# Patient Record
Sex: Female | Born: 1945 | Race: White | Hispanic: No | Marital: Married | Smoking: Former smoker
Health system: Southern US, Community
[De-identification: ages and names within clinical notes are randomized; demographics above are authoritative.]

## PROBLEM LIST (undated history)

## (undated) DIAGNOSIS — R42 Dizziness and giddiness: Secondary | ICD-10-CM

## (undated) DIAGNOSIS — M1990 Unspecified osteoarthritis, unspecified site: Secondary | ICD-10-CM

## (undated) DIAGNOSIS — R011 Cardiac murmur, unspecified: Secondary | ICD-10-CM

## (undated) DIAGNOSIS — T7840XA Allergy, unspecified, initial encounter: Secondary | ICD-10-CM

## (undated) DIAGNOSIS — J302 Other seasonal allergic rhinitis: Secondary | ICD-10-CM

## (undated) DIAGNOSIS — I10 Essential (primary) hypertension: Secondary | ICD-10-CM

## (undated) DIAGNOSIS — E785 Hyperlipidemia, unspecified: Secondary | ICD-10-CM

## (undated) HISTORY — DX: Dizziness and giddiness: R42

## (undated) HISTORY — DX: Essential (primary) hypertension: I10

## (undated) HISTORY — DX: Allergy, unspecified, initial encounter: T78.40XA

## (undated) HISTORY — DX: Unspecified osteoarthritis, unspecified site: M19.90

## (undated) HISTORY — DX: Hyperlipidemia, unspecified: E78.5

## (undated) HISTORY — DX: Cardiac murmur, unspecified: R01.1

## (undated) HISTORY — DX: Other seasonal allergic rhinitis: J30.2

---

## 1988-08-31 HISTORY — PX: BACK SURGERY: SHX140

## 1994-08-31 HISTORY — PX: TOE SURGERY: SHX1073

## 1995-09-01 HISTORY — PX: FOOT SURGERY: SHX648

## 1998-08-31 HISTORY — PX: ABDOMINAL HYSTERECTOMY: SHX81

## 2004-08-31 HISTORY — PX: KNEE SURGERY: SHX244

## 2008-08-31 HISTORY — PX: HAND SURGERY: SHX662

## 2011-02-20 ENCOUNTER — Ambulatory Visit (HOSPITAL_BASED_OUTPATIENT_CLINIC_OR_DEPARTMENT_OTHER)
Admission: RE | Admit: 2011-02-20 | Discharge: 2011-02-20 | Disposition: A | Discharge status: Home / Self Care | Payer: BC Managed Care – PPO | Source: Ambulatory Visit | Attending: Orthopedic Surgery | Admitting: Orthopedic Surgery

## 2011-02-20 DIAGNOSIS — Z01812 Encounter for preprocedural laboratory examination: Secondary | ICD-10-CM | POA: Insufficient documentation

## 2011-02-20 DIAGNOSIS — S82409A Unspecified fracture of shaft of unspecified fibula, initial encounter for closed fracture: Secondary | ICD-10-CM | POA: Insufficient documentation

## 2011-02-20 DIAGNOSIS — S93439A Sprain of tibiofibular ligament of unspecified ankle, initial encounter: Secondary | ICD-10-CM | POA: Insufficient documentation

## 2011-02-20 DIAGNOSIS — X58XXXA Exposure to other specified factors, initial encounter: Secondary | ICD-10-CM | POA: Insufficient documentation

## 2011-02-23 LAB — POCT HEMOGLOBIN-HEMACUE: Hemoglobin: 14.6 g/dL (ref 12.0–15.0)

## 2011-03-16 NOTE — Op Note (Signed)
Meghan Carpenter, Meghan Carpenter                 ACCOUNT NO.:  [id number]  MEDICAL RECORD NO.:  [id number]  LOCATION:                                 FACILITY:  PHYSICIAN:  Leonides Grills, M.D.          DATE OF BIRTH:  DATE OF PROCEDURE:  02/20/2011 DATE OF DISCHARGE:                              OPERATIVE REPORT   PREOPERATIVE DIAGNOSES: 1. Right distal tibia-fibula syndesmotic rupture. 2. Right proximal fibular shaft fracture.  POSTOPERATIVE DIAGNOSES: 1. Right distal tibia-fibula syndesmotic rupture. 2. Right proximal fibular shaft fracture.  OPERATIONS: 1. Open reduction and internal fixation, right distal tibia-fibula     syndesmotic rupture. 2. Conservative management with manipulation, fibular shaft fracture.  ANESTHESIA:  General.  SURGEON:  Leonides Grills, MD  ASSISTANT:  Richardean Canal, PA  ESTIMATED BLOOD LOSS:  Minimal.  TOURNIQUET TIME:  Approximately half an hour.  COMPLICATIONS:  None.  DISPOSITION:  Stable to PR.  INDICATIONS:  This is a 65 year old female who sustained the above injury.  She was consented to the above procedure.  All risks including infection neurovascular injury, nonunion, malunion, hardware tissue, and hardware failure, persistent pain, worsening pain, prolonged recovery, stiffness, arthritis, the fact that this the screws to be removed likely in the future, wound healing problems, DVT, and PE were all explained. Questions were encouraged and answered.  OPERATION:  The patient was brought to the operating room and placed in supine position after adequate general anesthesia administered as well as Ancef 1 g IV piggyback.  The right lower extremity was then prepped and draped in a sterile manner over proximally thigh tourniquet once bump was placed on the ipsilateral hip.  Anatomical landmarks including fibular shaft were then mapped out.  Under C-arm guidance, we then mapped out the joint line as well.  We then made a longitudinal incision over  the lateral malleolus.  Dissection was carried down through skin. Hemostasis was obtained.  We then made a small nick on the medial aspect of the ankle over the metaphyseal flare.  We then applied a 2-hole one- third tubular plate in a posterolateral position on the lateral malleolus.  This would act as a washer.  We then openly reduced the syndesmosis applying a Darrick Penna clamp to the proximal most portion of the plate.  Once this was anatomically reduced and with the ankle in neutral dorsiflexion, we then placed a 4.5-mm fully-threaded cortical set screw using a 3.2-mm drill hole respectively.  This was done parallel to the joint surface through the 2-hole plate.  This had excellent purchase and maintenance of the desired reduction.  We then removed the Scnetx clamp and then placed another 4.5-mm fully- threaded cortical set screw using a 3.2-mm drill hole respectively. Again, this had excellent purchase and maintenance of the anatomic reduction.  The fibular shaft which was indirectly reduced by doing the reduction of the syndesmosis was left to heal conservatively. Anatomical landmarks include Shenton line in the ankle and dime sign were restored.  Range of motion of the ankle was excellent.  Stress x- rays were obtained in the AP, lateral, and mortise views, and showed no gross motion fixation,  proposition, excellent alignment as well.  No lateral subluxation of the talus within the ankle mortise or diastasis of the distal tib-fib syndesmosis.  Tourniquet was deflated.  Hemostasis was obtained.  The wound was copiously irrigated with  normal saline. Subcu was closed with 3-0 Vicryl.  Skin was closed with 4-0 nylon. Sterile dressing was applied.  Modified Jones dressing was applied.  The patient was stable to PR.     Leonides Grills, M.D.     PB/MEDQ  D:  02/20/2011  T:  02/21/2011  Job:  [id number]  Electronically Signed by Leonides Grills M.D. on 03/16/2011 05:54:17 PM

## 2011-09-01 HISTORY — PX: ANKLE SURGERY: SHX546

## 2013-09-08 DIAGNOSIS — R52 Pain, unspecified: Secondary | ICD-10-CM | POA: Diagnosis not present

## 2013-09-08 DIAGNOSIS — R509 Fever, unspecified: Secondary | ICD-10-CM | POA: Diagnosis not present

## 2013-09-08 DIAGNOSIS — R05 Cough: Secondary | ICD-10-CM | POA: Diagnosis not present

## 2013-09-08 DIAGNOSIS — R059 Cough, unspecified: Secondary | ICD-10-CM | POA: Diagnosis not present

## 2013-09-08 DIAGNOSIS — J019 Acute sinusitis, unspecified: Secondary | ICD-10-CM | POA: Diagnosis not present

## 2013-09-08 DIAGNOSIS — Z8709 Personal history of other diseases of the respiratory system: Secondary | ICD-10-CM | POA: Diagnosis not present

## 2013-09-08 DIAGNOSIS — J029 Acute pharyngitis, unspecified: Secondary | ICD-10-CM | POA: Diagnosis not present

## 2013-09-14 DIAGNOSIS — Z1211 Encounter for screening for malignant neoplasm of colon: Secondary | ICD-10-CM | POA: Diagnosis not present

## 2013-09-29 DIAGNOSIS — R040 Epistaxis: Secondary | ICD-10-CM | POA: Diagnosis not present

## 2013-09-29 DIAGNOSIS — J019 Acute sinusitis, unspecified: Secondary | ICD-10-CM | POA: Diagnosis not present

## 2013-11-06 DIAGNOSIS — G4700 Insomnia, unspecified: Secondary | ICD-10-CM | POA: Diagnosis not present

## 2013-11-06 DIAGNOSIS — Z13820 Encounter for screening for osteoporosis: Secondary | ICD-10-CM | POA: Diagnosis not present

## 2013-11-06 DIAGNOSIS — D509 Iron deficiency anemia, unspecified: Secondary | ICD-10-CM | POA: Diagnosis not present

## 2013-11-06 DIAGNOSIS — Z1211 Encounter for screening for malignant neoplasm of colon: Secondary | ICD-10-CM | POA: Diagnosis not present

## 2013-11-06 DIAGNOSIS — Z0000 Encounter for general adult medical examination without abnormal findings: Secondary | ICD-10-CM | POA: Diagnosis not present

## 2013-11-06 DIAGNOSIS — Z1231 Encounter for screening mammogram for malignant neoplasm of breast: Secondary | ICD-10-CM | POA: Diagnosis not present

## 2013-11-06 DIAGNOSIS — E785 Hyperlipidemia, unspecified: Secondary | ICD-10-CM | POA: Diagnosis not present

## 2013-11-06 DIAGNOSIS — N951 Menopausal and female climacteric states: Secondary | ICD-10-CM | POA: Diagnosis not present

## 2013-11-06 DIAGNOSIS — R7309 Other abnormal glucose: Secondary | ICD-10-CM | POA: Diagnosis not present

## 2013-11-15 DIAGNOSIS — Z13820 Encounter for screening for osteoporosis: Secondary | ICD-10-CM | POA: Diagnosis not present

## 2013-11-15 DIAGNOSIS — Z1231 Encounter for screening mammogram for malignant neoplasm of breast: Secondary | ICD-10-CM | POA: Diagnosis not present

## 2013-12-04 DIAGNOSIS — J0100 Acute maxillary sinusitis, unspecified: Secondary | ICD-10-CM | POA: Diagnosis not present

## 2013-12-04 DIAGNOSIS — Z6837 Body mass index (BMI) 37.0-37.9, adult: Secondary | ICD-10-CM | POA: Diagnosis not present

## 2014-01-03 DIAGNOSIS — E669 Obesity, unspecified: Secondary | ICD-10-CM | POA: Diagnosis not present

## 2014-01-03 DIAGNOSIS — Z6837 Body mass index (BMI) 37.0-37.9, adult: Secondary | ICD-10-CM | POA: Diagnosis not present

## 2014-01-03 DIAGNOSIS — E785 Hyperlipidemia, unspecified: Secondary | ICD-10-CM | POA: Diagnosis not present

## 2014-01-31 DIAGNOSIS — Z6837 Body mass index (BMI) 37.0-37.9, adult: Secondary | ICD-10-CM | POA: Diagnosis not present

## 2014-02-21 DIAGNOSIS — R5381 Other malaise: Secondary | ICD-10-CM | POA: Diagnosis not present

## 2014-02-21 DIAGNOSIS — Z6837 Body mass index (BMI) 37.0-37.9, adult: Secondary | ICD-10-CM | POA: Diagnosis not present

## 2014-03-23 DIAGNOSIS — Z6837 Body mass index (BMI) 37.0-37.9, adult: Secondary | ICD-10-CM | POA: Diagnosis not present

## 2014-03-24 DIAGNOSIS — J029 Acute pharyngitis, unspecified: Secondary | ICD-10-CM | POA: Diagnosis not present

## 2014-03-26 DIAGNOSIS — T3695XA Adverse effect of unspecified systemic antibiotic, initial encounter: Secondary | ICD-10-CM | POA: Diagnosis not present

## 2014-03-26 DIAGNOSIS — J029 Acute pharyngitis, unspecified: Secondary | ICD-10-CM | POA: Diagnosis not present

## 2014-03-26 DIAGNOSIS — J069 Acute upper respiratory infection, unspecified: Secondary | ICD-10-CM | POA: Diagnosis not present

## 2014-03-26 DIAGNOSIS — R197 Diarrhea, unspecified: Secondary | ICD-10-CM | POA: Diagnosis not present

## 2014-04-20 DIAGNOSIS — Z6837 Body mass index (BMI) 37.0-37.9, adult: Secondary | ICD-10-CM | POA: Diagnosis not present

## 2014-04-20 DIAGNOSIS — E785 Hyperlipidemia, unspecified: Secondary | ICD-10-CM | POA: Diagnosis not present

## 2014-04-20 DIAGNOSIS — E669 Obesity, unspecified: Secondary | ICD-10-CM | POA: Diagnosis not present

## 2014-05-09 DIAGNOSIS — R7309 Other abnormal glucose: Secondary | ICD-10-CM | POA: Diagnosis not present

## 2014-05-09 DIAGNOSIS — E669 Obesity, unspecified: Secondary | ICD-10-CM | POA: Diagnosis not present

## 2014-05-09 DIAGNOSIS — Z79899 Other long term (current) drug therapy: Secondary | ICD-10-CM | POA: Diagnosis not present

## 2014-05-09 DIAGNOSIS — E785 Hyperlipidemia, unspecified: Secondary | ICD-10-CM | POA: Diagnosis not present

## 2014-05-09 DIAGNOSIS — R011 Cardiac murmur, unspecified: Secondary | ICD-10-CM | POA: Diagnosis not present

## 2014-05-09 DIAGNOSIS — N951 Menopausal and female climacteric states: Secondary | ICD-10-CM | POA: Diagnosis not present

## 2014-05-09 DIAGNOSIS — D509 Iron deficiency anemia, unspecified: Secondary | ICD-10-CM | POA: Diagnosis not present

## 2014-05-09 DIAGNOSIS — E162 Hypoglycemia, unspecified: Secondary | ICD-10-CM | POA: Diagnosis not present

## 2014-05-29 DIAGNOSIS — Z6837 Body mass index (BMI) 37.0-37.9, adult: Secondary | ICD-10-CM | POA: Diagnosis not present

## 2014-05-29 DIAGNOSIS — E669 Obesity, unspecified: Secondary | ICD-10-CM | POA: Diagnosis not present

## 2014-05-29 DIAGNOSIS — R7309 Other abnormal glucose: Secondary | ICD-10-CM | POA: Diagnosis not present

## 2014-06-05 DIAGNOSIS — D485 Neoplasm of uncertain behavior of skin: Secondary | ICD-10-CM | POA: Diagnosis not present

## 2014-06-05 DIAGNOSIS — L301 Dyshidrosis [pompholyx]: Secondary | ICD-10-CM | POA: Diagnosis not present

## 2014-06-25 DIAGNOSIS — Z6836 Body mass index (BMI) 36.0-36.9, adult: Secondary | ICD-10-CM | POA: Diagnosis not present

## 2014-07-23 DIAGNOSIS — E669 Obesity, unspecified: Secondary | ICD-10-CM | POA: Diagnosis not present

## 2014-07-23 DIAGNOSIS — Z6836 Body mass index (BMI) 36.0-36.9, adult: Secondary | ICD-10-CM | POA: Diagnosis not present

## 2014-08-10 DIAGNOSIS — Z23 Encounter for immunization: Secondary | ICD-10-CM | POA: Diagnosis not present

## 2014-08-10 DIAGNOSIS — R7309 Other abnormal glucose: Secondary | ICD-10-CM | POA: Diagnosis not present

## 2014-08-10 DIAGNOSIS — Z79899 Other long term (current) drug therapy: Secondary | ICD-10-CM | POA: Diagnosis not present

## 2014-08-10 DIAGNOSIS — M2550 Pain in unspecified joint: Secondary | ICD-10-CM | POA: Diagnosis not present

## 2014-08-10 DIAGNOSIS — N951 Menopausal and female climacteric states: Secondary | ICD-10-CM | POA: Diagnosis not present

## 2014-08-10 DIAGNOSIS — M5430 Sciatica, unspecified side: Secondary | ICD-10-CM | POA: Diagnosis not present

## 2014-08-10 DIAGNOSIS — E785 Hyperlipidemia, unspecified: Secondary | ICD-10-CM | POA: Diagnosis not present

## 2014-08-10 DIAGNOSIS — D509 Iron deficiency anemia, unspecified: Secondary | ICD-10-CM | POA: Diagnosis not present

## 2014-08-10 DIAGNOSIS — E669 Obesity, unspecified: Secondary | ICD-10-CM | POA: Diagnosis not present

## 2014-08-20 DIAGNOSIS — Z6837 Body mass index (BMI) 37.0-37.9, adult: Secondary | ICD-10-CM | POA: Diagnosis not present

## 2014-08-26 DIAGNOSIS — M545 Low back pain: Secondary | ICD-10-CM | POA: Diagnosis not present

## 2014-09-06 DIAGNOSIS — M545 Low back pain: Secondary | ICD-10-CM | POA: Diagnosis not present

## 2014-09-07 DIAGNOSIS — M545 Low back pain: Secondary | ICD-10-CM | POA: Diagnosis not present

## 2014-09-11 DIAGNOSIS — M545 Low back pain: Secondary | ICD-10-CM | POA: Diagnosis not present

## 2014-09-13 DIAGNOSIS — M545 Low back pain: Secondary | ICD-10-CM | POA: Diagnosis not present

## 2014-09-18 DIAGNOSIS — M545 Low back pain: Secondary | ICD-10-CM | POA: Diagnosis not present

## 2014-09-25 DIAGNOSIS — M545 Low back pain: Secondary | ICD-10-CM | POA: Diagnosis not present

## 2014-09-26 DIAGNOSIS — Z6837 Body mass index (BMI) 37.0-37.9, adult: Secondary | ICD-10-CM | POA: Diagnosis not present

## 2014-09-27 DIAGNOSIS — M545 Low back pain: Secondary | ICD-10-CM | POA: Diagnosis not present

## 2014-10-02 DIAGNOSIS — M545 Low back pain: Secondary | ICD-10-CM | POA: Diagnosis not present

## 2014-10-04 DIAGNOSIS — H5212 Myopia, left eye: Secondary | ICD-10-CM | POA: Diagnosis not present

## 2014-10-04 DIAGNOSIS — H35373 Puckering of macula, bilateral: Secondary | ICD-10-CM | POA: Diagnosis not present

## 2014-10-04 DIAGNOSIS — H52221 Regular astigmatism, right eye: Secondary | ICD-10-CM | POA: Diagnosis not present

## 2014-10-04 DIAGNOSIS — M545 Low back pain: Secondary | ICD-10-CM | POA: Diagnosis not present

## 2014-10-04 DIAGNOSIS — H524 Presbyopia: Secondary | ICD-10-CM | POA: Diagnosis not present

## 2014-10-04 DIAGNOSIS — H5211 Myopia, right eye: Secondary | ICD-10-CM | POA: Diagnosis not present

## 2014-10-10 DIAGNOSIS — M545 Low back pain: Secondary | ICD-10-CM | POA: Diagnosis not present

## 2014-10-12 DIAGNOSIS — M545 Low back pain: Secondary | ICD-10-CM | POA: Diagnosis not present

## 2014-10-17 DIAGNOSIS — M545 Low back pain: Secondary | ICD-10-CM | POA: Diagnosis not present

## 2014-10-19 DIAGNOSIS — M545 Low back pain: Secondary | ICD-10-CM | POA: Diagnosis not present

## 2014-10-29 DIAGNOSIS — I10 Essential (primary) hypertension: Secondary | ICD-10-CM | POA: Diagnosis not present

## 2014-10-29 DIAGNOSIS — Z6836 Body mass index (BMI) 36.0-36.9, adult: Secondary | ICD-10-CM | POA: Diagnosis not present

## 2014-10-29 DIAGNOSIS — M439 Deforming dorsopathy, unspecified: Secondary | ICD-10-CM | POA: Diagnosis not present

## 2014-10-29 DIAGNOSIS — M436 Torticollis: Secondary | ICD-10-CM | POA: Diagnosis not present

## 2014-11-07 DIAGNOSIS — M542 Cervicalgia: Secondary | ICD-10-CM | POA: Diagnosis not present

## 2014-11-15 DIAGNOSIS — M436 Torticollis: Secondary | ICD-10-CM | POA: Diagnosis not present

## 2014-11-15 DIAGNOSIS — Z1211 Encounter for screening for malignant neoplasm of colon: Secondary | ICD-10-CM | POA: Diagnosis not present

## 2014-11-15 DIAGNOSIS — Z0000 Encounter for general adult medical examination without abnormal findings: Secondary | ICD-10-CM | POA: Diagnosis not present

## 2014-11-15 DIAGNOSIS — Z13220 Encounter for screening for lipoid disorders: Secondary | ICD-10-CM | POA: Diagnosis not present

## 2014-11-15 DIAGNOSIS — Z1231 Encounter for screening mammogram for malignant neoplasm of breast: Secondary | ICD-10-CM | POA: Diagnosis not present

## 2014-11-15 DIAGNOSIS — Z1389 Encounter for screening for other disorder: Secondary | ICD-10-CM | POA: Diagnosis not present

## 2014-11-15 DIAGNOSIS — Z6836 Body mass index (BMI) 36.0-36.9, adult: Secondary | ICD-10-CM | POA: Diagnosis not present

## 2014-11-15 DIAGNOSIS — I10 Essential (primary) hypertension: Secondary | ICD-10-CM | POA: Diagnosis not present

## 2014-11-15 DIAGNOSIS — Z9181 History of falling: Secondary | ICD-10-CM | POA: Diagnosis not present

## 2014-11-15 DIAGNOSIS — E559 Vitamin D deficiency, unspecified: Secondary | ICD-10-CM | POA: Diagnosis not present

## 2014-11-15 DIAGNOSIS — Z1212 Encounter for screening for malignant neoplasm of rectum: Secondary | ICD-10-CM | POA: Diagnosis not present

## 2014-11-15 DIAGNOSIS — Z23 Encounter for immunization: Secondary | ICD-10-CM | POA: Diagnosis not present

## 2014-11-21 DIAGNOSIS — Z1231 Encounter for screening mammogram for malignant neoplasm of breast: Secondary | ICD-10-CM | POA: Diagnosis not present

## 2014-11-26 DIAGNOSIS — S161XXD Strain of muscle, fascia and tendon at neck level, subsequent encounter: Secondary | ICD-10-CM | POA: Diagnosis not present

## 2014-11-30 DIAGNOSIS — S161XXD Strain of muscle, fascia and tendon at neck level, subsequent encounter: Secondary | ICD-10-CM | POA: Diagnosis not present

## 2014-12-04 DIAGNOSIS — S161XXD Strain of muscle, fascia and tendon at neck level, subsequent encounter: Secondary | ICD-10-CM | POA: Diagnosis not present

## 2014-12-07 DIAGNOSIS — S161XXD Strain of muscle, fascia and tendon at neck level, subsequent encounter: Secondary | ICD-10-CM | POA: Diagnosis not present

## 2014-12-12 DIAGNOSIS — S161XXD Strain of muscle, fascia and tendon at neck level, subsequent encounter: Secondary | ICD-10-CM | POA: Diagnosis not present

## 2014-12-21 DIAGNOSIS — Z800 Family history of malignant neoplasm of digestive organs: Secondary | ICD-10-CM | POA: Diagnosis not present

## 2014-12-21 DIAGNOSIS — Z1211 Encounter for screening for malignant neoplasm of colon: Secondary | ICD-10-CM | POA: Diagnosis not present

## 2015-01-15 DIAGNOSIS — I10 Essential (primary) hypertension: Secondary | ICD-10-CM | POA: Diagnosis not present

## 2015-01-15 DIAGNOSIS — M436 Torticollis: Secondary | ICD-10-CM | POA: Diagnosis not present

## 2015-01-15 DIAGNOSIS — Z6836 Body mass index (BMI) 36.0-36.9, adult: Secondary | ICD-10-CM | POA: Diagnosis not present

## 2015-01-15 DIAGNOSIS — M7071 Other bursitis of hip, right hip: Secondary | ICD-10-CM | POA: Diagnosis not present

## 2015-01-15 DIAGNOSIS — Z1389 Encounter for screening for other disorder: Secondary | ICD-10-CM | POA: Diagnosis not present

## 2015-01-15 DIAGNOSIS — E559 Vitamin D deficiency, unspecified: Secondary | ICD-10-CM | POA: Diagnosis not present

## 2015-01-15 DIAGNOSIS — J309 Allergic rhinitis, unspecified: Secondary | ICD-10-CM | POA: Diagnosis not present

## 2015-01-15 DIAGNOSIS — E785 Hyperlipidemia, unspecified: Secondary | ICD-10-CM | POA: Diagnosis not present

## 2015-01-16 DIAGNOSIS — K648 Other hemorrhoids: Secondary | ICD-10-CM | POA: Diagnosis not present

## 2015-01-16 DIAGNOSIS — Z87891 Personal history of nicotine dependence: Secondary | ICD-10-CM | POA: Diagnosis not present

## 2015-01-16 DIAGNOSIS — Z800 Family history of malignant neoplasm of digestive organs: Secondary | ICD-10-CM | POA: Diagnosis not present

## 2015-01-16 DIAGNOSIS — E559 Vitamin D deficiency, unspecified: Secondary | ICD-10-CM | POA: Diagnosis not present

## 2015-01-16 DIAGNOSIS — K5730 Diverticulosis of large intestine without perforation or abscess without bleeding: Secondary | ICD-10-CM | POA: Diagnosis not present

## 2015-01-16 DIAGNOSIS — Z1211 Encounter for screening for malignant neoplasm of colon: Secondary | ICD-10-CM | POA: Diagnosis not present

## 2015-01-16 DIAGNOSIS — D124 Benign neoplasm of descending colon: Secondary | ICD-10-CM | POA: Diagnosis not present

## 2015-01-16 DIAGNOSIS — I10 Essential (primary) hypertension: Secondary | ICD-10-CM | POA: Diagnosis not present

## 2015-02-16 DIAGNOSIS — S40861A Insect bite (nonvenomous) of right upper arm, initial encounter: Secondary | ICD-10-CM | POA: Diagnosis not present

## 2015-04-25 DIAGNOSIS — Z6836 Body mass index (BMI) 36.0-36.9, adult: Secondary | ICD-10-CM | POA: Diagnosis not present

## 2015-04-25 DIAGNOSIS — E785 Hyperlipidemia, unspecified: Secondary | ICD-10-CM | POA: Diagnosis not present

## 2015-04-25 DIAGNOSIS — M545 Low back pain: Secondary | ICD-10-CM | POA: Diagnosis not present

## 2015-05-14 DIAGNOSIS — M7071 Other bursitis of hip, right hip: Secondary | ICD-10-CM | POA: Diagnosis not present

## 2015-05-14 DIAGNOSIS — Z6835 Body mass index (BMI) 35.0-35.9, adult: Secondary | ICD-10-CM | POA: Diagnosis not present

## 2015-05-14 DIAGNOSIS — M25561 Pain in right knee: Secondary | ICD-10-CM | POA: Diagnosis not present

## 2015-05-24 DIAGNOSIS — J0101 Acute recurrent maxillary sinusitis: Secondary | ICD-10-CM | POA: Diagnosis not present

## 2015-05-29 DIAGNOSIS — H81399 Other peripheral vertigo, unspecified ear: Secondary | ICD-10-CM | POA: Diagnosis not present

## 2015-05-29 DIAGNOSIS — Z6835 Body mass index (BMI) 35.0-35.9, adult: Secondary | ICD-10-CM | POA: Diagnosis not present

## 2015-05-29 DIAGNOSIS — J309 Allergic rhinitis, unspecified: Secondary | ICD-10-CM | POA: Diagnosis not present

## 2015-05-29 DIAGNOSIS — M179 Osteoarthritis of knee, unspecified: Secondary | ICD-10-CM | POA: Diagnosis not present

## 2015-06-10 DIAGNOSIS — Z6835 Body mass index (BMI) 35.0-35.9, adult: Secondary | ICD-10-CM | POA: Diagnosis not present

## 2015-06-10 DIAGNOSIS — M1990 Unspecified osteoarthritis, unspecified site: Secondary | ICD-10-CM | POA: Diagnosis not present

## 2015-06-10 DIAGNOSIS — N951 Menopausal and female climacteric states: Secondary | ICD-10-CM | POA: Diagnosis not present

## 2015-06-10 DIAGNOSIS — J309 Allergic rhinitis, unspecified: Secondary | ICD-10-CM | POA: Diagnosis not present

## 2015-06-10 DIAGNOSIS — E785 Hyperlipidemia, unspecified: Secondary | ICD-10-CM | POA: Diagnosis not present

## 2015-06-10 DIAGNOSIS — E559 Vitamin D deficiency, unspecified: Secondary | ICD-10-CM | POA: Diagnosis not present

## 2015-06-10 DIAGNOSIS — I10 Essential (primary) hypertension: Secondary | ICD-10-CM | POA: Diagnosis not present

## 2015-06-10 DIAGNOSIS — Z23 Encounter for immunization: Secondary | ICD-10-CM | POA: Diagnosis not present

## 2015-06-10 DIAGNOSIS — M545 Low back pain: Secondary | ICD-10-CM | POA: Diagnosis not present

## 2015-09-27 DIAGNOSIS — J0100 Acute maxillary sinusitis, unspecified: Secondary | ICD-10-CM | POA: Diagnosis not present

## 2015-10-14 DIAGNOSIS — J309 Allergic rhinitis, unspecified: Secondary | ICD-10-CM | POA: Diagnosis not present

## 2015-10-14 DIAGNOSIS — I10 Essential (primary) hypertension: Secondary | ICD-10-CM | POA: Diagnosis not present

## 2015-10-14 DIAGNOSIS — E785 Hyperlipidemia, unspecified: Secondary | ICD-10-CM | POA: Diagnosis not present

## 2015-10-14 DIAGNOSIS — N3281 Overactive bladder: Secondary | ICD-10-CM | POA: Diagnosis not present

## 2015-10-14 DIAGNOSIS — Z6835 Body mass index (BMI) 35.0-35.9, adult: Secondary | ICD-10-CM | POA: Diagnosis not present

## 2015-10-14 DIAGNOSIS — M79604 Pain in right leg: Secondary | ICD-10-CM | POA: Diagnosis not present

## 2015-10-14 DIAGNOSIS — E559 Vitamin D deficiency, unspecified: Secondary | ICD-10-CM | POA: Diagnosis not present

## 2015-10-14 DIAGNOSIS — M79661 Pain in right lower leg: Secondary | ICD-10-CM | POA: Diagnosis not present

## 2015-10-21 DIAGNOSIS — H25093 Other age-related incipient cataract, bilateral: Secondary | ICD-10-CM | POA: Diagnosis not present

## 2015-10-21 DIAGNOSIS — H52223 Regular astigmatism, bilateral: Secondary | ICD-10-CM | POA: Diagnosis not present

## 2015-10-21 DIAGNOSIS — H43393 Other vitreous opacities, bilateral: Secondary | ICD-10-CM | POA: Diagnosis not present

## 2015-10-21 DIAGNOSIS — H524 Presbyopia: Secondary | ICD-10-CM | POA: Diagnosis not present

## 2015-10-21 DIAGNOSIS — H35373 Puckering of macula, bilateral: Secondary | ICD-10-CM | POA: Diagnosis not present

## 2015-10-21 DIAGNOSIS — H43813 Vitreous degeneration, bilateral: Secondary | ICD-10-CM | POA: Diagnosis not present

## 2015-10-21 DIAGNOSIS — H5213 Myopia, bilateral: Secondary | ICD-10-CM | POA: Diagnosis not present

## 2015-10-28 DIAGNOSIS — M79602 Pain in left arm: Secondary | ICD-10-CM | POA: Diagnosis not present

## 2015-10-28 DIAGNOSIS — J0100 Acute maxillary sinusitis, unspecified: Secondary | ICD-10-CM | POA: Diagnosis not present

## 2015-11-06 DIAGNOSIS — K529 Noninfective gastroenteritis and colitis, unspecified: Secondary | ICD-10-CM | POA: Diagnosis not present

## 2015-11-06 DIAGNOSIS — J309 Allergic rhinitis, unspecified: Secondary | ICD-10-CM | POA: Diagnosis not present

## 2015-11-18 DIAGNOSIS — E559 Vitamin D deficiency, unspecified: Secondary | ICD-10-CM | POA: Diagnosis not present

## 2015-11-18 DIAGNOSIS — E785 Hyperlipidemia, unspecified: Secondary | ICD-10-CM | POA: Diagnosis not present

## 2015-11-18 DIAGNOSIS — Z0000 Encounter for general adult medical examination without abnormal findings: Secondary | ICD-10-CM | POA: Diagnosis not present

## 2015-11-25 DIAGNOSIS — J309 Allergic rhinitis, unspecified: Secondary | ICD-10-CM | POA: Diagnosis not present

## 2015-11-25 DIAGNOSIS — E669 Obesity, unspecified: Secondary | ICD-10-CM | POA: Diagnosis not present

## 2015-11-25 DIAGNOSIS — I10 Essential (primary) hypertension: Secondary | ICD-10-CM | POA: Diagnosis not present

## 2015-11-25 DIAGNOSIS — N3281 Overactive bladder: Secondary | ICD-10-CM | POA: Diagnosis not present

## 2015-11-25 DIAGNOSIS — Z0000 Encounter for general adult medical examination without abnormal findings: Secondary | ICD-10-CM | POA: Diagnosis not present

## 2015-11-25 DIAGNOSIS — Z9181 History of falling: Secondary | ICD-10-CM | POA: Diagnosis not present

## 2015-11-25 DIAGNOSIS — Z6835 Body mass index (BMI) 35.0-35.9, adult: Secondary | ICD-10-CM | POA: Diagnosis not present

## 2015-12-09 DIAGNOSIS — E785 Hyperlipidemia, unspecified: Secondary | ICD-10-CM | POA: Diagnosis not present

## 2015-12-09 DIAGNOSIS — Z6835 Body mass index (BMI) 35.0-35.9, adult: Secondary | ICD-10-CM | POA: Diagnosis not present

## 2015-12-09 DIAGNOSIS — H68009 Unspecified Eustachian salpingitis, unspecified ear: Secondary | ICD-10-CM | POA: Diagnosis not present

## 2015-12-09 DIAGNOSIS — Z9181 History of falling: Secondary | ICD-10-CM | POA: Diagnosis not present

## 2015-12-09 DIAGNOSIS — J309 Allergic rhinitis, unspecified: Secondary | ICD-10-CM | POA: Diagnosis not present

## 2015-12-12 DIAGNOSIS — Z1231 Encounter for screening mammogram for malignant neoplasm of breast: Secondary | ICD-10-CM | POA: Diagnosis not present

## 2015-12-27 DIAGNOSIS — Z6834 Body mass index (BMI) 34.0-34.9, adult: Secondary | ICD-10-CM | POA: Diagnosis not present

## 2015-12-27 DIAGNOSIS — L259 Unspecified contact dermatitis, unspecified cause: Secondary | ICD-10-CM | POA: Diagnosis not present

## 2016-02-10 DIAGNOSIS — J309 Allergic rhinitis, unspecified: Secondary | ICD-10-CM | POA: Diagnosis not present

## 2016-02-10 DIAGNOSIS — Z6835 Body mass index (BMI) 35.0-35.9, adult: Secondary | ICD-10-CM | POA: Diagnosis not present

## 2016-02-10 DIAGNOSIS — H68009 Unspecified Eustachian salpingitis, unspecified ear: Secondary | ICD-10-CM | POA: Diagnosis not present

## 2016-02-24 DIAGNOSIS — H68009 Unspecified Eustachian salpingitis, unspecified ear: Secondary | ICD-10-CM | POA: Diagnosis not present

## 2016-02-24 DIAGNOSIS — Z6835 Body mass index (BMI) 35.0-35.9, adult: Secondary | ICD-10-CM | POA: Diagnosis not present

## 2016-02-24 DIAGNOSIS — J309 Allergic rhinitis, unspecified: Secondary | ICD-10-CM | POA: Diagnosis not present

## 2016-02-24 DIAGNOSIS — R252 Cramp and spasm: Secondary | ICD-10-CM | POA: Diagnosis not present

## 2016-02-24 DIAGNOSIS — I10 Essential (primary) hypertension: Secondary | ICD-10-CM | POA: Diagnosis not present

## 2016-03-30 DIAGNOSIS — J309 Allergic rhinitis, unspecified: Secondary | ICD-10-CM | POA: Diagnosis not present

## 2016-03-30 DIAGNOSIS — E669 Obesity, unspecified: Secondary | ICD-10-CM | POA: Diagnosis not present

## 2016-03-30 DIAGNOSIS — N951 Menopausal and female climacteric states: Secondary | ICD-10-CM | POA: Diagnosis not present

## 2016-03-30 DIAGNOSIS — I10 Essential (primary) hypertension: Secondary | ICD-10-CM | POA: Diagnosis not present

## 2016-03-30 DIAGNOSIS — Z6834 Body mass index (BMI) 34.0-34.9, adult: Secondary | ICD-10-CM | POA: Diagnosis not present

## 2016-05-12 DIAGNOSIS — L820 Inflamed seborrheic keratosis: Secondary | ICD-10-CM | POA: Diagnosis not present

## 2016-05-12 DIAGNOSIS — R233 Spontaneous ecchymoses: Secondary | ICD-10-CM | POA: Diagnosis not present

## 2016-05-12 DIAGNOSIS — L853 Xerosis cutis: Secondary | ICD-10-CM | POA: Diagnosis not present

## 2016-05-19 DIAGNOSIS — Z6835 Body mass index (BMI) 35.0-35.9, adult: Secondary | ICD-10-CM | POA: Diagnosis not present

## 2016-05-19 DIAGNOSIS — R600 Localized edema: Secondary | ICD-10-CM | POA: Diagnosis not present

## 2016-05-19 DIAGNOSIS — Z1389 Encounter for screening for other disorder: Secondary | ICD-10-CM | POA: Diagnosis not present

## 2016-05-19 DIAGNOSIS — E669 Obesity, unspecified: Secondary | ICD-10-CM | POA: Diagnosis not present

## 2016-05-19 DIAGNOSIS — M25561 Pain in right knee: Secondary | ICD-10-CM | POA: Diagnosis not present

## 2016-05-19 DIAGNOSIS — Z23 Encounter for immunization: Secondary | ICD-10-CM | POA: Diagnosis not present

## 2016-06-29 DIAGNOSIS — H04123 Dry eye syndrome of bilateral lacrimal glands: Secondary | ICD-10-CM | POA: Diagnosis not present

## 2016-06-29 DIAGNOSIS — H2513 Age-related nuclear cataract, bilateral: Secondary | ICD-10-CM | POA: Diagnosis not present

## 2016-07-02 DIAGNOSIS — E785 Hyperlipidemia, unspecified: Secondary | ICD-10-CM | POA: Diagnosis not present

## 2016-07-02 DIAGNOSIS — N951 Menopausal and female climacteric states: Secondary | ICD-10-CM | POA: Diagnosis not present

## 2016-07-02 DIAGNOSIS — I10 Essential (primary) hypertension: Secondary | ICD-10-CM | POA: Diagnosis not present

## 2016-07-02 DIAGNOSIS — J309 Allergic rhinitis, unspecified: Secondary | ICD-10-CM | POA: Diagnosis not present

## 2016-07-02 DIAGNOSIS — Z6835 Body mass index (BMI) 35.0-35.9, adult: Secondary | ICD-10-CM | POA: Diagnosis not present

## 2016-07-02 DIAGNOSIS — M159 Polyosteoarthritis, unspecified: Secondary | ICD-10-CM | POA: Diagnosis not present

## 2016-07-02 DIAGNOSIS — Z23 Encounter for immunization: Secondary | ICD-10-CM | POA: Diagnosis not present

## 2016-07-02 DIAGNOSIS — Z79899 Other long term (current) drug therapy: Secondary | ICD-10-CM | POA: Diagnosis not present

## 2016-07-02 DIAGNOSIS — E559 Vitamin D deficiency, unspecified: Secondary | ICD-10-CM | POA: Diagnosis not present

## 2016-08-03 DIAGNOSIS — J029 Acute pharyngitis, unspecified: Secondary | ICD-10-CM | POA: Diagnosis not present

## 2016-10-29 DIAGNOSIS — H68009 Unspecified Eustachian salpingitis, unspecified ear: Secondary | ICD-10-CM | POA: Diagnosis not present

## 2016-10-29 DIAGNOSIS — N3281 Overactive bladder: Secondary | ICD-10-CM | POA: Diagnosis not present

## 2016-10-29 DIAGNOSIS — M1711 Unilateral primary osteoarthritis, right knee: Secondary | ICD-10-CM | POA: Diagnosis not present

## 2016-10-29 DIAGNOSIS — Z79899 Other long term (current) drug therapy: Secondary | ICD-10-CM | POA: Diagnosis not present

## 2016-10-29 DIAGNOSIS — E785 Hyperlipidemia, unspecified: Secondary | ICD-10-CM | POA: Diagnosis not present

## 2016-10-29 DIAGNOSIS — Z131 Encounter for screening for diabetes mellitus: Secondary | ICD-10-CM | POA: Diagnosis not present

## 2016-10-29 DIAGNOSIS — E669 Obesity, unspecified: Secondary | ICD-10-CM | POA: Diagnosis not present

## 2016-10-29 DIAGNOSIS — Z6836 Body mass index (BMI) 36.0-36.9, adult: Secondary | ICD-10-CM | POA: Diagnosis not present

## 2016-10-29 DIAGNOSIS — Z139 Encounter for screening, unspecified: Secondary | ICD-10-CM | POA: Diagnosis not present

## 2016-10-29 DIAGNOSIS — J309 Allergic rhinitis, unspecified: Secondary | ICD-10-CM | POA: Diagnosis not present

## 2016-10-29 DIAGNOSIS — E559 Vitamin D deficiency, unspecified: Secondary | ICD-10-CM | POA: Diagnosis not present

## 2016-10-29 DIAGNOSIS — I10 Essential (primary) hypertension: Secondary | ICD-10-CM | POA: Diagnosis not present

## 2016-11-05 DIAGNOSIS — M1711 Unilateral primary osteoarthritis, right knee: Secondary | ICD-10-CM | POA: Diagnosis not present

## 2016-11-09 DIAGNOSIS — M25561 Pain in right knee: Secondary | ICD-10-CM | POA: Diagnosis not present

## 2016-11-09 DIAGNOSIS — M2141 Flat foot [pes planus] (acquired), right foot: Secondary | ICD-10-CM | POA: Diagnosis not present

## 2016-11-09 DIAGNOSIS — R293 Abnormal posture: Secondary | ICD-10-CM | POA: Diagnosis not present

## 2016-11-25 DIAGNOSIS — M25561 Pain in right knee: Secondary | ICD-10-CM | POA: Diagnosis not present

## 2016-11-25 DIAGNOSIS — M2141 Flat foot [pes planus] (acquired), right foot: Secondary | ICD-10-CM | POA: Diagnosis not present

## 2016-11-25 DIAGNOSIS — R293 Abnormal posture: Secondary | ICD-10-CM | POA: Diagnosis not present

## 2017-01-05 DIAGNOSIS — M19011 Primary osteoarthritis, right shoulder: Secondary | ICD-10-CM | POA: Diagnosis not present

## 2017-01-05 DIAGNOSIS — J309 Allergic rhinitis, unspecified: Secondary | ICD-10-CM | POA: Diagnosis not present

## 2017-01-05 DIAGNOSIS — E669 Obesity, unspecified: Secondary | ICD-10-CM | POA: Diagnosis not present

## 2017-01-05 DIAGNOSIS — Z6836 Body mass index (BMI) 36.0-36.9, adult: Secondary | ICD-10-CM | POA: Diagnosis not present

## 2017-01-08 DIAGNOSIS — M25512 Pain in left shoulder: Secondary | ICD-10-CM | POA: Diagnosis not present

## 2017-01-08 DIAGNOSIS — M79621 Pain in right upper arm: Secondary | ICD-10-CM | POA: Diagnosis not present

## 2017-01-08 DIAGNOSIS — M6281 Muscle weakness (generalized): Secondary | ICD-10-CM | POA: Diagnosis not present

## 2017-01-08 DIAGNOSIS — M25612 Stiffness of left shoulder, not elsewhere classified: Secondary | ICD-10-CM | POA: Diagnosis not present

## 2017-01-08 DIAGNOSIS — M25511 Pain in right shoulder: Secondary | ICD-10-CM | POA: Diagnosis not present

## 2017-01-08 DIAGNOSIS — Z1389 Encounter for screening for other disorder: Secondary | ICD-10-CM | POA: Diagnosis not present

## 2017-01-08 DIAGNOSIS — Z9181 History of falling: Secondary | ICD-10-CM | POA: Diagnosis not present

## 2017-01-08 DIAGNOSIS — N959 Unspecified menopausal and perimenopausal disorder: Secondary | ICD-10-CM | POA: Diagnosis not present

## 2017-01-08 DIAGNOSIS — M19011 Primary osteoarthritis, right shoulder: Secondary | ICD-10-CM | POA: Diagnosis not present

## 2017-01-08 DIAGNOSIS — Z1231 Encounter for screening mammogram for malignant neoplasm of breast: Secondary | ICD-10-CM | POA: Diagnosis not present

## 2017-01-08 DIAGNOSIS — Z0000 Encounter for general adult medical examination without abnormal findings: Secondary | ICD-10-CM | POA: Diagnosis not present

## 2017-01-08 DIAGNOSIS — Z6835 Body mass index (BMI) 35.0-35.9, adult: Secondary | ICD-10-CM | POA: Diagnosis not present

## 2017-01-08 DIAGNOSIS — E785 Hyperlipidemia, unspecified: Secondary | ICD-10-CM | POA: Diagnosis not present

## 2017-01-08 DIAGNOSIS — M25611 Stiffness of right shoulder, not elsewhere classified: Secondary | ICD-10-CM | POA: Diagnosis not present

## 2017-01-08 DIAGNOSIS — M542 Cervicalgia: Secondary | ICD-10-CM | POA: Diagnosis not present

## 2017-01-08 DIAGNOSIS — M2560 Stiffness of unspecified joint, not elsewhere classified: Secondary | ICD-10-CM | POA: Diagnosis not present

## 2017-01-08 DIAGNOSIS — R293 Abnormal posture: Secondary | ICD-10-CM | POA: Diagnosis not present

## 2017-01-18 DIAGNOSIS — H2513 Age-related nuclear cataract, bilateral: Secondary | ICD-10-CM | POA: Diagnosis not present

## 2017-01-26 DIAGNOSIS — E669 Obesity, unspecified: Secondary | ICD-10-CM | POA: Diagnosis not present

## 2017-01-26 DIAGNOSIS — R42 Dizziness and giddiness: Secondary | ICD-10-CM | POA: Diagnosis not present

## 2017-01-26 DIAGNOSIS — Z6835 Body mass index (BMI) 35.0-35.9, adult: Secondary | ICD-10-CM | POA: Diagnosis not present

## 2017-01-28 DIAGNOSIS — N959 Unspecified menopausal and perimenopausal disorder: Secondary | ICD-10-CM | POA: Diagnosis not present

## 2017-01-28 DIAGNOSIS — Z13820 Encounter for screening for osteoporosis: Secondary | ICD-10-CM | POA: Diagnosis not present

## 2017-01-28 DIAGNOSIS — Z1231 Encounter for screening mammogram for malignant neoplasm of breast: Secondary | ICD-10-CM | POA: Diagnosis not present

## 2017-02-02 DIAGNOSIS — M542 Cervicalgia: Secondary | ICD-10-CM | POA: Diagnosis not present

## 2017-02-02 DIAGNOSIS — M25611 Stiffness of right shoulder, not elsewhere classified: Secondary | ICD-10-CM | POA: Diagnosis not present

## 2017-02-02 DIAGNOSIS — M25512 Pain in left shoulder: Secondary | ICD-10-CM | POA: Diagnosis not present

## 2017-02-02 DIAGNOSIS — M25612 Stiffness of left shoulder, not elsewhere classified: Secondary | ICD-10-CM | POA: Diagnosis not present

## 2017-02-02 DIAGNOSIS — M79621 Pain in right upper arm: Secondary | ICD-10-CM | POA: Diagnosis not present

## 2017-02-02 DIAGNOSIS — M6281 Muscle weakness (generalized): Secondary | ICD-10-CM | POA: Diagnosis not present

## 2017-02-02 DIAGNOSIS — M19011 Primary osteoarthritis, right shoulder: Secondary | ICD-10-CM | POA: Diagnosis not present

## 2017-02-02 DIAGNOSIS — M25511 Pain in right shoulder: Secondary | ICD-10-CM | POA: Diagnosis not present

## 2017-02-02 DIAGNOSIS — M2560 Stiffness of unspecified joint, not elsewhere classified: Secondary | ICD-10-CM | POA: Diagnosis not present

## 2017-02-02 DIAGNOSIS — R293 Abnormal posture: Secondary | ICD-10-CM | POA: Diagnosis not present

## 2017-02-11 DIAGNOSIS — M1711 Unilateral primary osteoarthritis, right knee: Secondary | ICD-10-CM | POA: Diagnosis not present

## 2017-03-10 DIAGNOSIS — N951 Menopausal and female climacteric states: Secondary | ICD-10-CM | POA: Diagnosis not present

## 2017-03-10 DIAGNOSIS — M1711 Unilateral primary osteoarthritis, right knee: Secondary | ICD-10-CM | POA: Diagnosis not present

## 2017-03-10 DIAGNOSIS — J309 Allergic rhinitis, unspecified: Secondary | ICD-10-CM | POA: Diagnosis not present

## 2017-03-10 DIAGNOSIS — E785 Hyperlipidemia, unspecified: Secondary | ICD-10-CM | POA: Diagnosis not present

## 2017-03-10 DIAGNOSIS — Z6835 Body mass index (BMI) 35.0-35.9, adult: Secondary | ICD-10-CM | POA: Diagnosis not present

## 2017-03-10 DIAGNOSIS — I10 Essential (primary) hypertension: Secondary | ICD-10-CM | POA: Diagnosis not present

## 2017-04-06 DIAGNOSIS — Z01818 Encounter for other preprocedural examination: Secondary | ICD-10-CM | POA: Diagnosis not present

## 2017-04-06 DIAGNOSIS — H35371 Puckering of macula, right eye: Secondary | ICD-10-CM | POA: Diagnosis not present

## 2017-04-06 DIAGNOSIS — H25811 Combined forms of age-related cataract, right eye: Secondary | ICD-10-CM | POA: Diagnosis not present

## 2017-04-13 DIAGNOSIS — H2511 Age-related nuclear cataract, right eye: Secondary | ICD-10-CM | POA: Diagnosis not present

## 2017-04-13 DIAGNOSIS — H25811 Combined forms of age-related cataract, right eye: Secondary | ICD-10-CM | POA: Diagnosis not present

## 2017-04-20 DIAGNOSIS — H25812 Combined forms of age-related cataract, left eye: Secondary | ICD-10-CM | POA: Diagnosis not present

## 2017-04-20 DIAGNOSIS — H2512 Age-related nuclear cataract, left eye: Secondary | ICD-10-CM | POA: Diagnosis not present

## 2017-04-28 DIAGNOSIS — I10 Essential (primary) hypertension: Secondary | ICD-10-CM | POA: Diagnosis not present

## 2017-04-28 DIAGNOSIS — E669 Obesity, unspecified: Secondary | ICD-10-CM | POA: Diagnosis not present

## 2017-04-28 DIAGNOSIS — M19012 Primary osteoarthritis, left shoulder: Secondary | ICD-10-CM | POA: Diagnosis not present

## 2017-04-28 DIAGNOSIS — Z6835 Body mass index (BMI) 35.0-35.9, adult: Secondary | ICD-10-CM | POA: Diagnosis not present

## 2017-06-03 DIAGNOSIS — M1711 Unilateral primary osteoarthritis, right knee: Secondary | ICD-10-CM | POA: Diagnosis not present

## 2017-06-18 DIAGNOSIS — Z23 Encounter for immunization: Secondary | ICD-10-CM | POA: Diagnosis not present

## 2017-07-13 DIAGNOSIS — Z6833 Body mass index (BMI) 33.0-33.9, adult: Secondary | ICD-10-CM | POA: Diagnosis not present

## 2017-07-13 DIAGNOSIS — J309 Allergic rhinitis, unspecified: Secondary | ICD-10-CM | POA: Diagnosis not present

## 2017-07-13 DIAGNOSIS — E669 Obesity, unspecified: Secondary | ICD-10-CM | POA: Diagnosis not present

## 2017-07-13 DIAGNOSIS — N3281 Overactive bladder: Secondary | ICD-10-CM | POA: Diagnosis not present

## 2017-07-13 DIAGNOSIS — D179 Benign lipomatous neoplasm, unspecified: Secondary | ICD-10-CM | POA: Diagnosis not present

## 2017-07-13 DIAGNOSIS — E559 Vitamin D deficiency, unspecified: Secondary | ICD-10-CM | POA: Diagnosis not present

## 2017-07-13 DIAGNOSIS — E785 Hyperlipidemia, unspecified: Secondary | ICD-10-CM | POA: Diagnosis not present

## 2017-07-13 DIAGNOSIS — Z79899 Other long term (current) drug therapy: Secondary | ICD-10-CM | POA: Diagnosis not present

## 2017-07-13 DIAGNOSIS — I10 Essential (primary) hypertension: Secondary | ICD-10-CM | POA: Diagnosis not present

## 2017-07-13 DIAGNOSIS — N951 Menopausal and female climacteric states: Secondary | ICD-10-CM | POA: Diagnosis not present

## 2017-07-19 DIAGNOSIS — M1711 Unilateral primary osteoarthritis, right knee: Secondary | ICD-10-CM | POA: Diagnosis not present

## 2017-08-05 DIAGNOSIS — M1711 Unilateral primary osteoarthritis, right knee: Secondary | ICD-10-CM | POA: Diagnosis not present

## 2017-08-11 DIAGNOSIS — J309 Allergic rhinitis, unspecified: Secondary | ICD-10-CM | POA: Diagnosis not present

## 2017-08-11 DIAGNOSIS — I10 Essential (primary) hypertension: Secondary | ICD-10-CM | POA: Diagnosis not present

## 2017-08-11 DIAGNOSIS — D179 Benign lipomatous neoplasm, unspecified: Secondary | ICD-10-CM | POA: Diagnosis not present

## 2017-08-11 DIAGNOSIS — N951 Menopausal and female climacteric states: Secondary | ICD-10-CM | POA: Diagnosis not present

## 2017-08-11 DIAGNOSIS — N3281 Overactive bladder: Secondary | ICD-10-CM | POA: Diagnosis not present

## 2017-08-11 DIAGNOSIS — E559 Vitamin D deficiency, unspecified: Secondary | ICD-10-CM | POA: Diagnosis not present

## 2017-08-11 DIAGNOSIS — M1990 Unspecified osteoarthritis, unspecified site: Secondary | ICD-10-CM | POA: Diagnosis not present

## 2017-08-11 DIAGNOSIS — Z6836 Body mass index (BMI) 36.0-36.9, adult: Secondary | ICD-10-CM | POA: Diagnosis not present

## 2017-08-11 DIAGNOSIS — E785 Hyperlipidemia, unspecified: Secondary | ICD-10-CM | POA: Diagnosis not present

## 2017-08-11 DIAGNOSIS — Z1331 Encounter for screening for depression: Secondary | ICD-10-CM | POA: Diagnosis not present

## 2017-08-31 HISTORY — PX: COLONOSCOPY: SHX174

## 2017-09-13 DIAGNOSIS — R42 Dizziness and giddiness: Secondary | ICD-10-CM | POA: Diagnosis not present

## 2017-09-13 DIAGNOSIS — J019 Acute sinusitis, unspecified: Secondary | ICD-10-CM | POA: Diagnosis not present

## 2017-10-19 DIAGNOSIS — J309 Allergic rhinitis, unspecified: Secondary | ICD-10-CM | POA: Diagnosis not present

## 2017-10-19 DIAGNOSIS — H6121 Impacted cerumen, right ear: Secondary | ICD-10-CM | POA: Diagnosis not present

## 2017-10-19 DIAGNOSIS — H1013 Acute atopic conjunctivitis, bilateral: Secondary | ICD-10-CM | POA: Diagnosis not present

## 2017-10-19 DIAGNOSIS — H68003 Unspecified Eustachian salpingitis, bilateral: Secondary | ICD-10-CM | POA: Diagnosis not present

## 2017-10-19 DIAGNOSIS — Z6836 Body mass index (BMI) 36.0-36.9, adult: Secondary | ICD-10-CM | POA: Diagnosis not present

## 2017-10-19 DIAGNOSIS — H6591 Unspecified nonsuppurative otitis media, right ear: Secondary | ICD-10-CM | POA: Diagnosis not present

## 2017-10-19 DIAGNOSIS — E669 Obesity, unspecified: Secondary | ICD-10-CM | POA: Diagnosis not present

## 2017-10-19 DIAGNOSIS — N3281 Overactive bladder: Secondary | ICD-10-CM | POA: Diagnosis not present

## 2017-11-14 DIAGNOSIS — J309 Allergic rhinitis, unspecified: Secondary | ICD-10-CM | POA: Diagnosis not present

## 2017-11-14 DIAGNOSIS — J019 Acute sinusitis, unspecified: Secondary | ICD-10-CM | POA: Diagnosis not present

## 2017-12-16 DIAGNOSIS — J309 Allergic rhinitis, unspecified: Secondary | ICD-10-CM | POA: Diagnosis not present

## 2017-12-16 DIAGNOSIS — E559 Vitamin D deficiency, unspecified: Secondary | ICD-10-CM | POA: Diagnosis not present

## 2017-12-16 DIAGNOSIS — E669 Obesity, unspecified: Secondary | ICD-10-CM | POA: Diagnosis not present

## 2017-12-16 DIAGNOSIS — Z131 Encounter for screening for diabetes mellitus: Secondary | ICD-10-CM | POA: Diagnosis not present

## 2017-12-16 DIAGNOSIS — E785 Hyperlipidemia, unspecified: Secondary | ICD-10-CM | POA: Diagnosis not present

## 2017-12-16 DIAGNOSIS — N3281 Overactive bladder: Secondary | ICD-10-CM | POA: Diagnosis not present

## 2017-12-16 DIAGNOSIS — Z6836 Body mass index (BMI) 36.0-36.9, adult: Secondary | ICD-10-CM | POA: Diagnosis not present

## 2017-12-16 DIAGNOSIS — I10 Essential (primary) hypertension: Secondary | ICD-10-CM | POA: Diagnosis not present

## 2017-12-16 DIAGNOSIS — M1990 Unspecified osteoarthritis, unspecified site: Secondary | ICD-10-CM | POA: Diagnosis not present

## 2017-12-16 DIAGNOSIS — Z79899 Other long term (current) drug therapy: Secondary | ICD-10-CM | POA: Diagnosis not present

## 2017-12-22 DIAGNOSIS — S81802A Unspecified open wound, left lower leg, initial encounter: Secondary | ICD-10-CM | POA: Diagnosis not present

## 2018-01-19 ENCOUNTER — Encounter: Payer: Self-pay | Admitting: Gastroenterology

## 2018-01-19 DIAGNOSIS — E785 Hyperlipidemia, unspecified: Secondary | ICD-10-CM | POA: Diagnosis not present

## 2018-01-19 DIAGNOSIS — Z1231 Encounter for screening mammogram for malignant neoplasm of breast: Secondary | ICD-10-CM | POA: Diagnosis not present

## 2018-01-19 DIAGNOSIS — Z1211 Encounter for screening for malignant neoplasm of colon: Secondary | ICD-10-CM | POA: Diagnosis not present

## 2018-01-19 DIAGNOSIS — Z0000 Encounter for general adult medical examination without abnormal findings: Secondary | ICD-10-CM | POA: Diagnosis not present

## 2018-01-19 DIAGNOSIS — Z139 Encounter for screening, unspecified: Secondary | ICD-10-CM | POA: Diagnosis not present

## 2018-01-19 DIAGNOSIS — Z1331 Encounter for screening for depression: Secondary | ICD-10-CM | POA: Diagnosis not present

## 2018-01-19 DIAGNOSIS — E669 Obesity, unspecified: Secondary | ICD-10-CM | POA: Diagnosis not present

## 2018-01-19 DIAGNOSIS — Z9181 History of falling: Secondary | ICD-10-CM | POA: Diagnosis not present

## 2018-01-19 DIAGNOSIS — Z6835 Body mass index (BMI) 35.0-35.9, adult: Secondary | ICD-10-CM | POA: Diagnosis not present

## 2018-02-08 DIAGNOSIS — Z1231 Encounter for screening mammogram for malignant neoplasm of breast: Secondary | ICD-10-CM | POA: Diagnosis not present

## 2018-02-09 DIAGNOSIS — N3281 Overactive bladder: Secondary | ICD-10-CM | POA: Diagnosis not present

## 2018-02-09 DIAGNOSIS — Z6835 Body mass index (BMI) 35.0-35.9, adult: Secondary | ICD-10-CM | POA: Diagnosis not present

## 2018-02-09 DIAGNOSIS — E669 Obesity, unspecified: Secondary | ICD-10-CM | POA: Diagnosis not present

## 2018-02-09 DIAGNOSIS — M436 Torticollis: Secondary | ICD-10-CM | POA: Diagnosis not present

## 2018-02-09 DIAGNOSIS — Z87828 Personal history of other (healed) physical injury and trauma: Secondary | ICD-10-CM | POA: Diagnosis not present

## 2018-02-21 ENCOUNTER — Ambulatory Visit (AMBULATORY_SURGERY_CENTER): Payer: Self-pay | Admitting: *Deleted

## 2018-02-21 ENCOUNTER — Encounter: Payer: Self-pay | Admitting: Gastroenterology

## 2018-02-21 ENCOUNTER — Other Ambulatory Visit: Payer: Self-pay

## 2018-02-21 VITALS — Ht 68.0 in | Wt 239.2 lb

## 2018-02-21 DIAGNOSIS — Z800 Family history of malignant neoplasm of digestive organs: Secondary | ICD-10-CM

## 2018-02-21 DIAGNOSIS — Z86010 Personal history of colonic polyps: Secondary | ICD-10-CM

## 2018-02-21 MED ORDER — SOD PICOSULFATE-MAG OX-CIT ACD 10-3.5-12 MG-GM -GM/160ML PO SOLN: 1.0000 | Freq: Once | ORAL | 0 refills | Status: AC

## 2018-02-21 NOTE — Progress Notes (Signed)
Denies allergies to eggs or soy products. Denies complications with sedation or anesthesia. Denies O2 use. Denies use of diet or weight loss medications.  Emmi instructions given for colonoscopy.  

## 2018-03-07 ENCOUNTER — Encounter: Payer: Self-pay | Admitting: Gastroenterology

## 2018-03-07 ENCOUNTER — Ambulatory Visit (AMBULATORY_SURGERY_CENTER): Payer: PPO | Admitting: Gastroenterology

## 2018-03-07 ENCOUNTER — Other Ambulatory Visit: Payer: Self-pay

## 2018-03-07 VITALS — BP 130/66 | HR 68 | Temp 97.3°F | Resp 11 | Ht 68.0 in | Wt 239.0 lb

## 2018-03-07 DIAGNOSIS — I10 Essential (primary) hypertension: Secondary | ICD-10-CM | POA: Diagnosis not present

## 2018-03-07 DIAGNOSIS — D124 Benign neoplasm of descending colon: Secondary | ICD-10-CM | POA: Diagnosis not present

## 2018-03-07 DIAGNOSIS — Z800 Family history of malignant neoplasm of digestive organs: Secondary | ICD-10-CM | POA: Diagnosis not present

## 2018-03-07 DIAGNOSIS — Z86010 Personal history of colonic polyps: Secondary | ICD-10-CM | POA: Diagnosis not present

## 2018-03-07 MED ORDER — SODIUM CHLORIDE 0.9 % IV SOLN: 500.0000 mL | Freq: Once | INTRAVENOUS | Status: DC

## 2018-03-07 NOTE — Progress Notes (Signed)
Report given to PACU, vss 

## 2018-03-07 NOTE — Patient Instructions (Signed)
INFORMATION ON POLYPS GIVEN.   YOU HAD AN ENDOSCOPIC PROCEDURE TODAY AT THE [facility]:   Refer to the procedure report that was given to you for any specific questions about what was found during the examination.  If the procedure report does not answer your questions, please call your gastroenterologist to clarify.  If you requested that your care partner not be given the details of your procedure findings, then the procedure report has been included in a sealed envelope for you to review at your convenience later.  YOU SHOULD EXPECT: Some feelings of bloating in the abdomen. Passage of more gas than usual.  Walking can help get rid of the air that was put into your GI tract during the procedure and reduce the bloating. If you had a lower endoscopy (such as a colonoscopy or flexible sigmoidoscopy) you may notice spotting of blood in your stool or on the toilet paper. If you underwent a bowel prep for your procedure, you may not have a normal bowel movement for a few days.  Please Note:  You might notice some irritation and congestion in your nose or some drainage.  This is from the oxygen used during your procedure.  There is no need for concern and it should clear up in a day or so.  SYMPTOMS TO REPORT IMMEDIATELY:   Following lower endoscopy (colonoscopy or flexible sigmoidoscopy):  Excessive amounts of blood in the stool  Significant tenderness or worsening of abdominal pains  Swelling of the abdomen that is new, acute  Fever of 100F or higher    For urgent or emergent issues, a gastroenterologist can be reached at any hour by calling [phone].   DIET:  We do recommend a small meal at first, but then you may proceed to your regular diet.  Drink plenty of fluids but you should avoid alcoholic beverages for 24 hours.  ACTIVITY:  You should plan to take it easy for the rest of today and you should NOT DRIVE or use heavy machinery until tomorrow (because of the sedation  medicines used during the test).    FOLLOW UP: Our staff will call the number listed on your records the next business day following your procedure to check on you and address any questions or concerns that you may have regarding the information given to you following your procedure. If we do not reach you, we will leave a message.  However, if you are feeling well and you are not experiencing any problems, there is no need to return our call.  We will assume that you have returned to your regular daily activities without incident.  If any biopsies were taken you will be contacted by phone or by letter within the next 1-3 weeks.  Please call us at [phone] if you have not heard about the biopsies in 3 weeks.    SIGNATURES/CONFIDENTIALITY: You and/or your care partner have signed paperwork which will be entered into your electronic medical record.  These signatures attest to the fact that that the information above on your After Visit Summary has been reviewed and is understood.  Full responsibility of the confidentiality of this discharge information lies with you and/or your care-partner. 

## 2018-03-07 NOTE — Op Note (Signed)
[facility] Patient Name: Meghan Carpenter Procedure Date: 03/07/2018 8:41 AM MRN: [id number] Endoscopist: Jackquline Denmark , MD Age: 72 Referring MD:  Date of Birth: Jun 26, 1946 Gender: Female Account #: [id number] Procedure:                Colonoscopy Indications:              High risk colon cancer surveillance: Personal                            history of colonic polyps. Family history of colon                            cancer in a first-degree relative-sister at the age                            of 18. Medicines:                Monitored Anesthesia Care Procedure:                Pre-Anesthesia Assessment:                           - Prior to the procedure, a History and Physical                            was performed, and patient medications and                            allergies were reviewed. The patient is competent.                            The risks and benefits of the procedure and the                            sedation options and risks were discussed with the                            patient. All questions were answered and informed                            consent was obtained. Patient identification and                            proposed procedure were verified by the physician                            in the procedure room. Mental Status Examination:                            alert and oriented. Prophylactic Antibiotics: The                            patient does not require prophylactic antibiotics.  Prior Anticoagulants: The patient has taken no                            previous anticoagulant or antiplatelet agents. ASA                            Grade Assessment: II - A patient with mild systemic                            disease. After reviewing the risks and benefits,                            the patient was deemed in satisfactory condition to                            undergo the procedure. The anesthesia plan  was to                            use monitored anesthesia care (MAC). Immediately                            prior to administration of medications, the patient                            was re-assessed for adequacy to receive sedatives.                            The heart rate, respiratory rate, oxygen                            saturations, blood pressure, adequacy of pulmonary                            ventilation, and response to care were monitored                            throughout the procedure. The physical status of                            the patient was re-assessed after the procedure.                           After obtaining informed consent, the colonoscope                            was passed under direct vision. Throughout the                            procedure, the patient's blood pressure, pulse, and                            oxygen saturations were monitored continuously. The  Colonoscope was introduced through the anus and                            advanced to the 2 cm into the ileum. The                            colonoscopy was performed without difficulty. The                            patient tolerated the procedure well. The quality                            of the bowel preparation was excellent. Scope In: 8:46:29 AM Scope Out: 8:59:19 AM Scope Withdrawal Time: 0 hours 9 minutes 27 seconds  Total Procedure Duration: 0 hours 12 minutes 50 seconds  Findings:                 A 8 mm polyp was found in the mid descending colon.                            The polyp was sessile. The polyp was removed with a                            cold snare. Resection and retrieval were complete.                            Verification of patient identification for the                            specimen was done. Estimated blood loss was minimal.                           A few small-mouthed diverticula were found in the                             sigmoid colon.                           Non-bleeding internal hemorrhoids were found during                            retroflexion. The hemorrhoids were small.                           The exam was otherwise without abnormality. Complications:            No immediate complications. Estimated Blood Loss:     Estimated blood loss: none. Impression:               - Colonic polyps status post polypectomy.                           - Mild sigmoid diverticulosis                           -  Small internal hemorrhoids. Recommendation:           - Patient has a contact number available for                            emergencies. The signs and symptoms of potential                            delayed complications were discussed with the                            patient. Return to normal activities tomorrow.                            Written discharge instructions were provided to the                            patient.                           - Resume previous diet.                           - Continue present medications.                           - Await pathology results.                           - Repeat colonoscopy for surveillance based on                            pathology results.                           - Return to GI clinic PRN. Jackquline Denmark, MD 03/07/2018 9:05:35 AM This report has been signed electronically.

## 2018-03-07 NOTE — Progress Notes (Signed)
Called to room to assist during endoscopic procedure.  Patient ID and intended procedure confirmed with present staff. Received instructions for my participation in the procedure from the performing physician.  

## 2018-03-08 ENCOUNTER — Telehealth: Payer: Self-pay

## 2018-03-08 NOTE — Telephone Encounter (Signed)
  Follow up Call-  Call back number 03/07/2018  Post procedure Call Back phone  # [phone]  Some recent data might be hidden     Patient questions:  Do you have a fever, pain , or abdominal swelling? No. Pain Score  0 *  Have you tolerated food without any problems? Yes.    Have you been able to return to your normal activities? Yes.    Do you have any questions about your discharge instructions: Diet   No. Medications  No. Follow up visit  No.  Do you have questions or concerns about your Care? No.  Actions: * If pain score is 4 or above: No action needed, pain <4.  No problems noted per pt. maw

## 2018-03-13 ENCOUNTER — Encounter: Payer: Self-pay | Admitting: Gastroenterology

## 2018-03-17 DIAGNOSIS — Z6835 Body mass index (BMI) 35.0-35.9, adult: Secondary | ICD-10-CM | POA: Diagnosis not present

## 2018-03-17 DIAGNOSIS — N3281 Overactive bladder: Secondary | ICD-10-CM | POA: Diagnosis not present

## 2018-03-17 DIAGNOSIS — E559 Vitamin D deficiency, unspecified: Secondary | ICD-10-CM | POA: Diagnosis not present

## 2018-03-17 DIAGNOSIS — M436 Torticollis: Secondary | ICD-10-CM | POA: Diagnosis not present

## 2018-03-17 DIAGNOSIS — E669 Obesity, unspecified: Secondary | ICD-10-CM | POA: Diagnosis not present

## 2018-03-17 DIAGNOSIS — Z1339 Encounter for screening examination for other mental health and behavioral disorders: Secondary | ICD-10-CM | POA: Diagnosis not present

## 2018-03-17 DIAGNOSIS — E785 Hyperlipidemia, unspecified: Secondary | ICD-10-CM | POA: Diagnosis not present

## 2018-03-17 DIAGNOSIS — I10 Essential (primary) hypertension: Secondary | ICD-10-CM | POA: Diagnosis not present

## 2018-03-22 DIAGNOSIS — M542 Cervicalgia: Secondary | ICD-10-CM | POA: Diagnosis not present

## 2018-03-24 DIAGNOSIS — M542 Cervicalgia: Secondary | ICD-10-CM | POA: Diagnosis not present

## 2018-03-29 DIAGNOSIS — M542 Cervicalgia: Secondary | ICD-10-CM | POA: Diagnosis not present

## 2018-03-31 DIAGNOSIS — M1711 Unilateral primary osteoarthritis, right knee: Secondary | ICD-10-CM | POA: Diagnosis not present

## 2018-04-01 DIAGNOSIS — M542 Cervicalgia: Secondary | ICD-10-CM | POA: Diagnosis not present

## 2018-04-12 DIAGNOSIS — M542 Cervicalgia: Secondary | ICD-10-CM | POA: Diagnosis not present

## 2018-04-15 DIAGNOSIS — M542 Cervicalgia: Secondary | ICD-10-CM | POA: Diagnosis not present

## 2018-04-19 DIAGNOSIS — M542 Cervicalgia: Secondary | ICD-10-CM | POA: Diagnosis not present

## 2018-04-26 DIAGNOSIS — M542 Cervicalgia: Secondary | ICD-10-CM | POA: Diagnosis not present

## 2018-05-07 DIAGNOSIS — M79672 Pain in left foot: Secondary | ICD-10-CM | POA: Diagnosis not present

## 2018-05-09 DIAGNOSIS — M5440 Lumbago with sciatica, unspecified side: Secondary | ICD-10-CM | POA: Diagnosis not present

## 2018-05-09 DIAGNOSIS — Z6835 Body mass index (BMI) 35.0-35.9, adult: Secondary | ICD-10-CM | POA: Diagnosis not present

## 2018-05-09 DIAGNOSIS — I10 Essential (primary) hypertension: Secondary | ICD-10-CM | POA: Diagnosis not present

## 2018-05-31 DIAGNOSIS — J309 Allergic rhinitis, unspecified: Secondary | ICD-10-CM | POA: Diagnosis not present

## 2018-05-31 DIAGNOSIS — N951 Menopausal and female climacteric states: Secondary | ICD-10-CM | POA: Diagnosis not present

## 2018-05-31 DIAGNOSIS — Z6835 Body mass index (BMI) 35.0-35.9, adult: Secondary | ICD-10-CM | POA: Diagnosis not present

## 2018-05-31 DIAGNOSIS — Z23 Encounter for immunization: Secondary | ICD-10-CM | POA: Diagnosis not present

## 2018-06-07 DIAGNOSIS — H2703 Aphakia, bilateral: Secondary | ICD-10-CM | POA: Diagnosis not present

## 2018-06-10 DIAGNOSIS — L03116 Cellulitis of left lower limb: Secondary | ICD-10-CM | POA: Diagnosis not present

## 2018-07-21 DIAGNOSIS — Z79899 Other long term (current) drug therapy: Secondary | ICD-10-CM | POA: Diagnosis not present

## 2018-07-21 DIAGNOSIS — I10 Essential (primary) hypertension: Secondary | ICD-10-CM | POA: Diagnosis not present

## 2018-07-21 DIAGNOSIS — E559 Vitamin D deficiency, unspecified: Secondary | ICD-10-CM | POA: Diagnosis not present

## 2018-07-21 DIAGNOSIS — N3281 Overactive bladder: Secondary | ICD-10-CM | POA: Diagnosis not present

## 2018-07-21 DIAGNOSIS — E669 Obesity, unspecified: Secondary | ICD-10-CM | POA: Diagnosis not present

## 2018-07-21 DIAGNOSIS — E785 Hyperlipidemia, unspecified: Secondary | ICD-10-CM | POA: Diagnosis not present

## 2018-07-21 DIAGNOSIS — R739 Hyperglycemia, unspecified: Secondary | ICD-10-CM | POA: Diagnosis not present

## 2018-07-21 DIAGNOSIS — Z6835 Body mass index (BMI) 35.0-35.9, adult: Secondary | ICD-10-CM | POA: Diagnosis not present

## 2018-07-21 DIAGNOSIS — J309 Allergic rhinitis, unspecified: Secondary | ICD-10-CM | POA: Diagnosis not present

## 2018-09-06 DIAGNOSIS — Z6835 Body mass index (BMI) 35.0-35.9, adult: Secondary | ICD-10-CM | POA: Diagnosis not present

## 2018-09-06 DIAGNOSIS — J069 Acute upper respiratory infection, unspecified: Secondary | ICD-10-CM | POA: Diagnosis not present

## 2018-10-25 DIAGNOSIS — E559 Vitamin D deficiency, unspecified: Secondary | ICD-10-CM | POA: Diagnosis not present

## 2018-10-25 DIAGNOSIS — E785 Hyperlipidemia, unspecified: Secondary | ICD-10-CM | POA: Diagnosis not present

## 2018-10-25 DIAGNOSIS — Z1331 Encounter for screening for depression: Secondary | ICD-10-CM | POA: Diagnosis not present

## 2018-10-25 DIAGNOSIS — I10 Essential (primary) hypertension: Secondary | ICD-10-CM | POA: Diagnosis not present

## 2018-10-25 DIAGNOSIS — Z79899 Other long term (current) drug therapy: Secondary | ICD-10-CM | POA: Diagnosis not present

## 2018-10-25 DIAGNOSIS — Z6836 Body mass index (BMI) 36.0-36.9, adult: Secondary | ICD-10-CM | POA: Diagnosis not present

## 2018-10-25 DIAGNOSIS — J309 Allergic rhinitis, unspecified: Secondary | ICD-10-CM | POA: Diagnosis not present

## 2018-10-25 DIAGNOSIS — N951 Menopausal and female climacteric states: Secondary | ICD-10-CM | POA: Diagnosis not present

## 2019-01-05 DIAGNOSIS — J309 Allergic rhinitis, unspecified: Secondary | ICD-10-CM | POA: Diagnosis not present

## 2019-01-05 DIAGNOSIS — K219 Gastro-esophageal reflux disease without esophagitis: Secondary | ICD-10-CM | POA: Diagnosis not present

## 2019-01-05 DIAGNOSIS — M19011 Primary osteoarthritis, right shoulder: Secondary | ICD-10-CM | POA: Diagnosis not present

## 2019-01-05 DIAGNOSIS — M159 Polyosteoarthritis, unspecified: Secondary | ICD-10-CM | POA: Diagnosis not present

## 2019-01-25 DIAGNOSIS — Z1231 Encounter for screening mammogram for malignant neoplasm of breast: Secondary | ICD-10-CM | POA: Diagnosis not present

## 2019-01-25 DIAGNOSIS — M19011 Primary osteoarthritis, right shoulder: Secondary | ICD-10-CM | POA: Diagnosis not present

## 2019-01-25 DIAGNOSIS — Z1331 Encounter for screening for depression: Secondary | ICD-10-CM | POA: Diagnosis not present

## 2019-01-25 DIAGNOSIS — N951 Menopausal and female climacteric states: Secondary | ICD-10-CM | POA: Diagnosis not present

## 2019-01-25 DIAGNOSIS — N3281 Overactive bladder: Secondary | ICD-10-CM | POA: Diagnosis not present

## 2019-01-25 DIAGNOSIS — R682 Dry mouth, unspecified: Secondary | ICD-10-CM | POA: Diagnosis not present

## 2019-01-25 DIAGNOSIS — Z0000 Encounter for general adult medical examination without abnormal findings: Secondary | ICD-10-CM | POA: Diagnosis not present

## 2019-01-25 DIAGNOSIS — Z6835 Body mass index (BMI) 35.0-35.9, adult: Secondary | ICD-10-CM | POA: Diagnosis not present

## 2019-01-25 DIAGNOSIS — E785 Hyperlipidemia, unspecified: Secondary | ICD-10-CM | POA: Diagnosis not present

## 2019-01-25 DIAGNOSIS — N959 Unspecified menopausal and perimenopausal disorder: Secondary | ICD-10-CM | POA: Diagnosis not present

## 2019-01-25 DIAGNOSIS — J309 Allergic rhinitis, unspecified: Secondary | ICD-10-CM | POA: Diagnosis not present

## 2019-01-25 DIAGNOSIS — Z139 Encounter for screening, unspecified: Secondary | ICD-10-CM | POA: Diagnosis not present

## 2019-01-25 DIAGNOSIS — Z9181 History of falling: Secondary | ICD-10-CM | POA: Diagnosis not present

## 2019-02-15 DIAGNOSIS — Z13820 Encounter for screening for osteoporosis: Secondary | ICD-10-CM | POA: Diagnosis not present

## 2019-02-15 DIAGNOSIS — M8589 Other specified disorders of bone density and structure, multiple sites: Secondary | ICD-10-CM | POA: Diagnosis not present

## 2019-02-27 DIAGNOSIS — M722 Plantar fascial fibromatosis: Secondary | ICD-10-CM | POA: Diagnosis not present

## 2019-03-15 ENCOUNTER — Other Ambulatory Visit: Payer: Self-pay | Admitting: Sports Medicine

## 2019-03-15 ENCOUNTER — Encounter: Payer: Self-pay | Admitting: Sports Medicine

## 2019-03-15 ENCOUNTER — Ambulatory Visit (INDEPENDENT_AMBULATORY_CARE_PROVIDER_SITE_OTHER): Payer: Medicare HMO | Admitting: Sports Medicine

## 2019-03-15 ENCOUNTER — Other Ambulatory Visit: Payer: Self-pay

## 2019-03-15 ENCOUNTER — Ambulatory Visit (INDEPENDENT_AMBULATORY_CARE_PROVIDER_SITE_OTHER): Payer: Medicare HMO

## 2019-03-15 VITALS — Temp 97.3°F | Resp 16

## 2019-03-15 DIAGNOSIS — M722 Plantar fascial fibromatosis: Secondary | ICD-10-CM

## 2019-03-15 DIAGNOSIS — M79672 Pain in left foot: Secondary | ICD-10-CM

## 2019-03-15 NOTE — Progress Notes (Addendum)
Subjective: Meghan Carpenter is a 73 y.o. female patient presents to office with complaint of moderate heel pain on the left that is now better pain is mostly in her arch 4-8 out of 10 reports that her primary care on last month gave her steroid that may be heel pain go away but not the arch reports that she feels a pulling sensation in her arch has tried topical pain cream and rub with some relief.  Patient denies swelling redness warmth to the area.  Denies any other pedal complaints.   Review of Systems  Musculoskeletal: Positive for joint pain and myalgias.  All other systems reviewed and are negative.    There are no active problems to display for this patient.   Current Outpatient Medications on File Prior to Visit  Medication Sig Dispense Refill  . cetirizine (ZYRTEC) 10 MG tablet Take 10 mg by mouth daily.    Marland Kitchen ezetimibe (ZETIA) 10 MG tablet Take 10 mg by mouth daily.    . fluticasone (FLONASE) 50 MCG/ACT nasal spray     . meloxicam (MOBIC) 15 MG tablet Take 15 mg by mouth daily.    . montelukast (SINGULAIR) 10 MG tablet      Current Facility-Administered Medications on File Prior to Visit  Medication Dose Route Frequency Provider Last Rate Last Dose  . 0.9 %  sodium chloride infusion  500 mL Intravenous Once Jackquline Denmark, MD        Allergies  Allergen Reactions  . Sulfa Antibiotics Nausea Only    Objective: Physical Exam General: The patient is alert and oriented x3 in no acute distress.  Dermatology: Skin is warm, dry and supple bilateral lower extremities. Nails 1-10 are normal. There is no erythema, edema, no eccymosis, no open lesions present. Integument is otherwise unremarkable.  Vascular: Dorsalis Pedis pulse and Posterior Tibial pulse are 2/4 bilateral. Capillary fill time is immediate to all digits.  Neurological: Grossly intact to light touch with an achilles reflex of +2/5 and a  negative Tinel's sign bilateral.  Musculoskeletal: No reproducible tenderness to  palpation at the medial calcaneal tubercale and through the insertion of the plantar fascia on the left however there is pain mid arch and distal first MPJ on the left, no pain with compression of calcaneus bilateral. No pain with tuning fork to calcaneus bilateral. No pain with calf compression bilateral. There is decreased Ankle joint range of motion bilateral. All other joints range of motion within normal limits bilateral. Strength 5/5 in all groups bilateral.     Xray, left foot:  Normal osseous mineralization. Joint spaces preserved. No fracture/dislocation/boney destruction. Calcaneal spur present with mild thickening of plantar fascia. No other soft tissue abnormalities or radiopaque foreign bodies.   Assessment and Plan: Problem List Items Addressed This Visit    None    Visit Diagnoses    Arch pain of left foot    -  Primary   Plantar fasciitis          -Complete examination performed.  -Xrays reviewed -Discussed with patient in detail the condition of arch pain with plantar fasciitis, how this occurs and general treatment options. Explained both conservative and surgical treatments.  -Continue with meloxicam which she is already on for her history of knee arthritis -Recommended good supportive shoes and advised use of OTC insert. Explained to patient that if these orthoses work well, we will continue with these. If these do not improve her condition and  pain, we will consider custom molded orthoses. -  Applied a plantar fascial strapping to patient left foot I advised patient after it is removed in 5 days may return to office for a plantar fascial brace since we did not have any in stock today -Explained and dispensed to patient daily stretching exercises. -Recommend patient to ice affected area 1-2x daily. -Patient to return to office in 4 weeks for follow up or sooner if problems or questions arise.  PATIENT CAME BACK BY OFFICE AND PICK UP PLANTAR FASCIAL BRACE AFTER SHE  REMOVED THE TAPING; CHARGES ENTERED  Landis Martins, DPM

## 2019-03-15 NOTE — Patient Instructions (Addendum)
Plantar Fasciitis (Heel Spur Syndrome) with Rehab The plantar fascia is a fibrous, ligament-like, soft-tissue structure that spans the bottom of the foot. Plantar fasciitis is a condition that causes pain in the foot due to inflammation of the tissue. SYMPTOMS   Pain and tenderness on the underneath side of the foot.  Pain that worsens with standing or walking. CAUSES  Plantar fasciitis is caused by irritation and injury to the plantar fascia on the underneath side of the foot. Common mechanisms of injury include:  Direct trauma to bottom of the foot.  Damage to a small nerve that runs under the foot where the main fascia attaches to the heel bone.  Stress placed on the plantar fascia due to bone spurs. RISK INCREASES WITH:   Activities that place stress on the plantar fascia (running, jumping, pivoting, or cutting).  Poor strength and flexibility.  Improperly fitted shoes.  Tight calf muscles.  Flat feet.  Failure to warm-up properly before activity.  Obesity. PREVENTION  Warm up and stretch properly before activity.  Allow for adequate recovery between workouts.  Maintain physical fitness:  Strength, flexibility, and endurance.  Cardiovascular fitness.  Maintain a health body weight.  Avoid stress on the plantar fascia.  Wear properly fitted shoes, including arch supports for individuals who have flat feet.  PROGNOSIS  If treated properly, then the symptoms of plantar fasciitis usually resolve without surgery. However, occasionally surgery is necessary.  RELATED COMPLICATIONS   Recurrent symptoms that may result in a chronic condition.  Problems of the lower back that are caused by compensating for the injury, such as limping.  Pain or weakness of the foot during push-off following surgery.  Chronic inflammation, scarring, and partial or complete fascia tear, occurring more often from repeated injections.  TREATMENT  Treatment initially involves the  use of ice and medication to help reduce pain and inflammation. The use of strengthening and stretching exercises may help reduce pain with activity, especially stretches of the Achilles tendon. These exercises may be performed at home or with a therapist. Your caregiver may recommend that you use heel cups of arch supports to help reduce stress on the plantar fascia. Occasionally, corticosteroid injections are given to reduce inflammation. If symptoms persist for greater than 6 months despite non-surgical (conservative), then surgery may be recommended.   MEDICATION   If pain medication is necessary, then nonsteroidal anti-inflammatory medications, such as aspirin and ibuprofen, or other minor pain relievers, such as acetaminophen, are often recommended.  Do not take pain medication within 7 days before surgery.  Prescription pain relievers may be given if deemed necessary by your caregiver. Use only as directed and only as much as you need.  Corticosteroid injections may be given by your caregiver. These injections should be reserved for the most serious cases, because they may only be given a certain number of times.  HEAT AND COLD  Cold treatment (icing) relieves pain and reduces inflammation. Cold treatment should be applied for 10 to 15 minutes every 2 to 3 hours for inflammation and pain and immediately after any activity that aggravates your symptoms. Use ice packs or massage the area with a piece of ice (ice massage).  Heat treatment may be used prior to performing the stretching and strengthening activities prescribed by your caregiver, physical therapist, or athletic trainer. Use a heat pack or soak the injury in warm water.  SEEK IMMEDIATE MEDICAL CARE IF:  Treatment seems to offer no benefit, or the condition worsens.  Any medications   produce adverse side effects.  EXERCISES- RANGE OF MOTION (ROM) AND STRETCHING EXERCISES - Plantar Fasciitis (Heel Spur Syndrome) These exercises  may help you when beginning to rehabilitate your injury. Your symptoms may resolve with or without further involvement from your physician, physical therapist or athletic trainer. While completing these exercises, remember:   Restoring tissue flexibility helps normal motion to return to the joints. This allows healthier, less painful movement and activity.  An effective stretch should be held for at least 30 seconds.  A stretch should never be painful. You should only feel a gentle lengthening or release in the stretched tissue.  RANGE OF MOTION - Toe Extension, Flexion  Sit with your right / left leg crossed over your opposite knee.  Grasp your toes and gently pull them back toward the top of your foot. You should feel a stretch on the bottom of your toes and/or foot.  Hold this stretch for 10 seconds.  Now, gently pull your toes toward the bottom of your foot. You should feel a stretch on the top of your toes and or foot.  Hold this stretch for 10 seconds. Repeat  times. Complete this stretch 3 times per day.   RANGE OF MOTION - Ankle Dorsiflexion, Active Assisted  Remove shoes and sit on a chair that is preferably not on a carpeted surface.  Place right / left foot under knee. Extend your opposite leg for support.  Keeping your heel down, slide your right / left foot back toward the chair until you feel a stretch at your ankle or calf. If you do not feel a stretch, slide your bottom forward to the edge of the chair, while still keeping your heel down.  Hold this stretch for 10 seconds. Repeat 3 times. Complete this stretch 2 times per day.   STRETCH  Gastroc, Standing  Place hands on wall.  Extend right / left leg, keeping the front knee somewhat bent.  Slightly point your toes inward on your back foot.  Keeping your right / left heel on the floor and your knee straight, shift your weight toward the wall, not allowing your back to arch.  You should feel a gentle stretch  in the right / left calf. Hold this position for 10 seconds. Repeat 3 times. Complete this stretch 2 times per day.  STRETCH  Soleus, Standing  Place hands on wall.  Extend right / left leg, keeping the other knee somewhat bent.  Slightly point your toes inward on your back foot.  Keep your right / left heel on the floor, bend your back knee, and slightly shift your weight over the back leg so that you feel a gentle stretch deep in your back calf.  Hold this position for 10 seconds. Repeat 3 times. Complete this stretch 2 times per day.  STRETCH  Gastrocsoleus, Standing  Note: This exercise can place a lot of stress on your foot and ankle. Please complete this exercise only if specifically instructed by your caregiver.   Place the ball of your right / left foot on a step, keeping your other foot firmly on the same step.  Hold on to the wall or a rail for balance.  Slowly lift your other foot, allowing your body weight to press your heel down over the edge of the step.  You should feel a stretch in your right / left calf.  Hold this position for 10 seconds.  Repeat this exercise with a slight bend in your right /   left knee. Repeat 3 times. Complete this stretch 2 times per day.   STRENGTHENING EXERCISES - Plantar Fasciitis (Heel Spur Syndrome)  These exercises may help you when beginning to rehabilitate your injury. They may resolve your symptoms with or without further involvement from your physician, physical therapist or athletic trainer. While completing these exercises, remember:   Muscles can gain both the endurance and the strength needed for everyday activities through controlled exercises.  Complete these exercises as instructed by your physician, physical therapist or athletic trainer. Progress the resistance and repetitions only as guided.  STRENGTH - Towel Curls  Sit in a chair positioned on a non-carpeted surface.  Place your foot on a towel, keeping your heel  on the floor.  Pull the towel toward your heel by only curling your toes. Keep your heel on the floor. Repeat 3 times. Complete this exercise 2 times per day.  STRENGTH - Ankle Inversion  Secure one end of a rubber exercise band/tubing to a fixed object (table, pole). Loop the other end around your foot just before your toes.  Place your fists between your knees. This will focus your strengthening at your ankle.  Slowly, pull your big toe up and in, making sure the band/tubing is positioned to resist the entire motion.  Hold this position for 10 seconds.  Have your muscles resist the band/tubing as it slowly pulls your foot back to the starting position. Repeat 3 times. Complete this exercises 2 times per day.  Document Released: 08/17/2005 Document Revised: 11/09/2011 Document Reviewed: 11/29/2008 [facility] Patient Information 2014 [place], [place].  STRAPPING INSTRUCTIONS  Strapping's need to be worn for 5 days for maximum benefit.  If you next appointment is before 5 days, please remove prior to coming in.  Try to avoid putting lotion on feet during the taping process.  The tape will not stick as well and will not be as effective.  If you were given stretching exercises, start these after removal of the tape.  These exercises will actually loosen the tape and you may not receive full benefit of the strapping.  It is important that you wear sturdy shoes at all times when walking.  Bedroom shoes, slippers, flip flops, etc. are not acceptable.  **If at any time while wearing the strapping you should notice any irritation such as a rash, redness, or itching, remove the tape and wash your foot/feet thoroughly.  BATHING INSTRUCTIONS  Take a washcloth, fold it a few times and tape it around your ankle.   Then take a garbage bag, place it over your foot and angle and tape it above and below the washcloth.  TAKE A QUICK SHOWER.  The washcloth should absorb any water that runs down into the  garbage bag.  If your foot gets wet, take a towel and absorb as much of the water as possible. You can also take a blow dryer, put it on low heat, and dry your bandage.

## 2019-03-15 NOTE — Progress Notes (Signed)
   Subjective:    Patient ID: Meghan Carpenter, female    DOB: 1946/07/31, 73 y.o.   MRN: [id number]  HPI    Review of Systems  Musculoskeletal: Positive for arthralgias and myalgias.  All other systems reviewed and are negative.      Objective:   Physical Exam        Assessment & Plan:

## 2019-03-21 DIAGNOSIS — Z1231 Encounter for screening mammogram for malignant neoplasm of breast: Secondary | ICD-10-CM | POA: Diagnosis not present

## 2019-04-05 ENCOUNTER — Other Ambulatory Visit: Payer: Self-pay

## 2019-04-05 ENCOUNTER — Ambulatory Visit (INDEPENDENT_AMBULATORY_CARE_PROVIDER_SITE_OTHER): Payer: Medicare HMO | Admitting: Sports Medicine

## 2019-04-05 ENCOUNTER — Encounter: Payer: Self-pay | Admitting: Sports Medicine

## 2019-04-05 VITALS — Temp 97.0°F | Resp 16

## 2019-04-05 DIAGNOSIS — M722 Plantar fascial fibromatosis: Secondary | ICD-10-CM

## 2019-04-05 DIAGNOSIS — M79672 Pain in left foot: Secondary | ICD-10-CM | POA: Diagnosis not present

## 2019-04-05 MED ORDER — TRIAMCINOLONE ACETONIDE 10 MG/ML IJ SUSP
10.0000 mg | Freq: Once | INTRAMUSCULAR | Status: AC
  Administered 2019-04-05: 10 mg

## 2019-04-05 NOTE — Progress Notes (Signed)
Subjective: Meghan Carpenter is a 73 y.o. female returns to office for follow up evaluation of left arch pain patient reports that the arch pain is better now pain is bad in the heel reports that she had an episode on Friday that was so bad that she had to walk with a cane states that the pain is worse in the morning and the heel 4 out of 10 constant in nature denies any swelling redness warmth or any injury.  Patient has been using plantar fascial brace with no problems or issues. Patient denies any recent changes in medications or new problems since last visit.  Currently taking meloxicam and icing as instructed.  There are no active problems to display for this patient.   Current Outpatient Medications on File Prior to Visit  Medication Sig Dispense Refill  . cetirizine (ZYRTEC) 10 MG tablet Take 10 mg by mouth daily.    . cloNIDine (CATAPRES) 0.2 MG tablet     . ezetimibe (ZETIA) 10 MG tablet Take 10 mg by mouth daily.    . fluticasone (FLONASE) 50 MCG/ACT nasal spray     . meloxicam (MOBIC) 15 MG tablet Take 15 mg by mouth daily.    . montelukast (SINGULAIR) 10 MG tablet      Current Facility-Administered Medications on File Prior to Visit  Medication Dose Route Frequency Provider Last Rate Last Dose  . 0.9 %  sodium chloride infusion  500 mL Intravenous Once Jackquline Denmark, MD        Allergies  Allergen Reactions  . Sulfa Antibiotics Nausea Only    Objective:   General:  Alert and oriented x 3, in no acute distress  Dermatology: Skin is warm, dry, and supple bilateral. Nails are within normal limits. There is no lower extremity erythema, no eccymosis, no open lesions present bilateral.   Vascular: Dorsalis Pedis and Posterior Tibial pedal pulses are 2/4 bilateral. + hair growth noted bilateral. Capillary Fill Time is 3 seconds in all digits. No varicosities, No edema bilateral lower extremities.   Neurological: Sensation grossly intact to light touch with an achilles reflex of +2 and  a  negative Tinel's sign bilateral. Vibratory, sharp/dull, Semmes Weinstein Monofilament within normal limits.   Musculoskeletal: There is tenderness to palpation at the medial calcaneal tubercale and through the insertion of the plantar fascia on the Left foot.  No pain to palpation at the arch.  No pain with compression to calcaneus or application of tuning fork. There is decreased Ankle joint range of motion bilateral. All other jointsrange of motion  within normal limits bilateral. Strength 5/5 bilateral.   Assessment and Plan: Problem List Items Addressed This Visit    None    Visit Diagnoses    Plantar fasciitis    -  Primary   Arch pain of left foot       Left foot pain          -Complete examination performed.  -Previous x-rays reviewed. -Discussed with patient in detail the condition of plantar fasciitis and arch pain, how this occurs related to the foot type of the patient and general treatment options. - Patient opted for injection today; After oral consent and aseptic prep, injected a mixture containing 1 ml of 1%plain lidocaine, 1 ml 0.5% plain marcaine, 0.5 ml of kenalog 10 and 0.5 ml of dexmethasone phosphate to left heel at the glabrous junction at plantar fascia -Continue with plantar fascial brace -Continue with stretching, icing, good supportive shoes, inserts daily.  -Discussed  long term care and reocurrence; will closely monitor; if fails to improve will consider other treatment modalities.  -Patient to return to office in 1 month for follow up or sooner if problems or questions arise.  Landis Martins, DPM

## 2019-04-12 ENCOUNTER — Ambulatory Visit: Payer: Medicare HMO | Admitting: Sports Medicine

## 2019-04-27 DIAGNOSIS — R682 Dry mouth, unspecified: Secondary | ICD-10-CM | POA: Diagnosis not present

## 2019-04-27 DIAGNOSIS — J309 Allergic rhinitis, unspecified: Secondary | ICD-10-CM | POA: Diagnosis not present

## 2019-04-27 DIAGNOSIS — I10 Essential (primary) hypertension: Secondary | ICD-10-CM | POA: Diagnosis not present

## 2019-04-27 DIAGNOSIS — N951 Menopausal and female climacteric states: Secondary | ICD-10-CM | POA: Diagnosis not present

## 2019-04-27 DIAGNOSIS — E785 Hyperlipidemia, unspecified: Secondary | ICD-10-CM | POA: Diagnosis not present

## 2019-04-27 DIAGNOSIS — Z79899 Other long term (current) drug therapy: Secondary | ICD-10-CM | POA: Diagnosis not present

## 2019-04-27 DIAGNOSIS — M19011 Primary osteoarthritis, right shoulder: Secondary | ICD-10-CM | POA: Diagnosis not present

## 2019-04-27 DIAGNOSIS — N3281 Overactive bladder: Secondary | ICD-10-CM | POA: Diagnosis not present

## 2019-05-03 DIAGNOSIS — L814 Other melanin hyperpigmentation: Secondary | ICD-10-CM | POA: Diagnosis not present

## 2019-05-03 DIAGNOSIS — D225 Melanocytic nevi of trunk: Secondary | ICD-10-CM | POA: Diagnosis not present

## 2019-05-03 DIAGNOSIS — L821 Other seborrheic keratosis: Secondary | ICD-10-CM | POA: Diagnosis not present

## 2019-05-03 DIAGNOSIS — D1801 Hemangioma of skin and subcutaneous tissue: Secondary | ICD-10-CM | POA: Diagnosis not present

## 2019-05-12 ENCOUNTER — Other Ambulatory Visit: Payer: Self-pay

## 2019-05-12 ENCOUNTER — Encounter: Payer: Self-pay | Admitting: Sports Medicine

## 2019-05-12 ENCOUNTER — Ambulatory Visit (INDEPENDENT_AMBULATORY_CARE_PROVIDER_SITE_OTHER): Payer: Medicare HMO | Admitting: Sports Medicine

## 2019-05-12 DIAGNOSIS — M79672 Pain in left foot: Secondary | ICD-10-CM | POA: Diagnosis not present

## 2019-05-12 DIAGNOSIS — M722 Plantar fascial fibromatosis: Secondary | ICD-10-CM | POA: Diagnosis not present

## 2019-05-12 NOTE — Progress Notes (Signed)
Subjective: Meghan Carpenter is a 73 y.o. female returns to office for follow up evaluation after Left heel injection for plantar fasciitis/arch pain, injection #1 administered 4 weeks ago. Patient states that the injection seems to help her pain; pain is now 0/10 and all treatments have helped. Patient denies any recent changes in medications or new problems since last visit.   There are no active problems to display for this patient.   Current Outpatient Medications on File Prior to Visit  Medication Sig Dispense Refill  . cetirizine (ZYRTEC) 10 MG tablet Take 10 mg by mouth daily.    . cloNIDine (CATAPRES) 0.2 MG tablet     . ezetimibe (ZETIA) 10 MG tablet Take 10 mg by mouth daily.    . fluticasone (FLONASE) 50 MCG/ACT nasal spray     . meloxicam (MOBIC) 15 MG tablet Take 15 mg by mouth daily.    . montelukast (SINGULAIR) 10 MG tablet      Current Facility-Administered Medications on File Prior to Visit  Medication Dose Route Frequency Provider Last Rate Last Dose  . 0.9 %  sodium chloride infusion  500 mL Intravenous Once Jackquline Denmark, MD        Allergies  Allergen Reactions  . Sulfa Antibiotics Nausea Only    Objective:   General:  Alert and oriented x 3, in no acute distress  Dermatology: Skin is warm, dry, and supple bilateral. Nails are within normal limits. There is no lower extremity erythema, no eccymosis, no open lesions present bilateral.   Vascular: Dorsalis Pedis and Posterior Tibial pedal pulses are 2/4 bilateral. + hair growth noted bilateral. Capillary Fill Time is 3 seconds in all digits. No varicosities, No edema bilateral lower extremities.   Neurological: Sensation grossly intact to light touch with an achilles reflex of +2 and a  negative Tinel's sign bilateral. Vibratory, sharp/dull, Semmes Weinstein Monofilament within normal limits.   Musculoskeletal: There is no tenderness to palpation at the arch and medial calcaneal tubercale and through the insertion of  the plantar fascia on the Left foot. No pain with compression to calcaneus or application of tuning fork. There is decreased Ankle joint range of motion bilateral. All other joints range of motion within normal limits bilateral. Strength 5/5 bilateral.   Assessment and Plan: Problem List Items Addressed This Visit    None    Visit Diagnoses    Plantar fasciitis    -  Primary   Arch pain of left foot       Left foot pain         -Complete examination performed.  -Previous x-rays reviewed. -Re-Discussed with patient in detail the condition of plantar fasciitis/arch pain that is now resolved -Continue with stretching, icing, good supportive shoes, inserts daily. -May slowly wean from fascial brace on left  -Patient to return to office as needed or sooner if problems or questions arise.  Landis Martins, DPM

## 2019-06-12 DIAGNOSIS — Z23 Encounter for immunization: Secondary | ICD-10-CM | POA: Diagnosis not present

## 2019-06-12 DIAGNOSIS — E559 Vitamin D deficiency, unspecified: Secondary | ICD-10-CM | POA: Diagnosis not present

## 2019-06-12 DIAGNOSIS — M19011 Primary osteoarthritis, right shoulder: Secondary | ICD-10-CM | POA: Diagnosis not present

## 2019-06-12 DIAGNOSIS — M542 Cervicalgia: Secondary | ICD-10-CM | POA: Diagnosis not present

## 2019-06-21 DIAGNOSIS — H2703 Aphakia, bilateral: Secondary | ICD-10-CM | POA: Diagnosis not present

## 2019-06-27 DIAGNOSIS — M47812 Spondylosis without myelopathy or radiculopathy, cervical region: Secondary | ICD-10-CM | POA: Diagnosis not present

## 2019-08-01 DIAGNOSIS — R7303 Prediabetes: Secondary | ICD-10-CM | POA: Diagnosis not present

## 2019-08-01 DIAGNOSIS — I10 Essential (primary) hypertension: Secondary | ICD-10-CM | POA: Diagnosis not present

## 2019-08-01 DIAGNOSIS — M47812 Spondylosis without myelopathy or radiculopathy, cervical region: Secondary | ICD-10-CM | POA: Diagnosis not present

## 2019-08-01 DIAGNOSIS — H8309 Labyrinthitis, unspecified ear: Secondary | ICD-10-CM | POA: Diagnosis not present

## 2019-08-01 DIAGNOSIS — E559 Vitamin D deficiency, unspecified: Secondary | ICD-10-CM | POA: Diagnosis not present

## 2019-08-01 DIAGNOSIS — K219 Gastro-esophageal reflux disease without esophagitis: Secondary | ICD-10-CM | POA: Diagnosis not present

## 2019-08-01 DIAGNOSIS — J309 Allergic rhinitis, unspecified: Secondary | ICD-10-CM | POA: Diagnosis not present

## 2019-08-01 DIAGNOSIS — M7550 Bursitis of unspecified shoulder: Secondary | ICD-10-CM | POA: Diagnosis not present

## 2019-08-01 DIAGNOSIS — N3281 Overactive bladder: Secondary | ICD-10-CM | POA: Diagnosis not present

## 2019-09-27 DIAGNOSIS — M19012 Primary osteoarthritis, left shoulder: Secondary | ICD-10-CM | POA: Diagnosis not present

## 2019-09-27 DIAGNOSIS — J309 Allergic rhinitis, unspecified: Secondary | ICD-10-CM | POA: Diagnosis not present

## 2019-09-27 DIAGNOSIS — N951 Menopausal and female climacteric states: Secondary | ICD-10-CM | POA: Diagnosis not present

## 2019-09-28 DIAGNOSIS — M47812 Spondylosis without myelopathy or radiculopathy, cervical region: Secondary | ICD-10-CM | POA: Diagnosis not present

## 2019-11-02 DIAGNOSIS — J309 Allergic rhinitis, unspecified: Secondary | ICD-10-CM | POA: Diagnosis not present

## 2019-11-02 DIAGNOSIS — E785 Hyperlipidemia, unspecified: Secondary | ICD-10-CM | POA: Diagnosis not present

## 2019-11-02 DIAGNOSIS — I10 Essential (primary) hypertension: Secondary | ICD-10-CM | POA: Diagnosis not present

## 2019-11-02 DIAGNOSIS — R7303 Prediabetes: Secondary | ICD-10-CM | POA: Diagnosis not present

## 2019-11-02 DIAGNOSIS — E559 Vitamin D deficiency, unspecified: Secondary | ICD-10-CM | POA: Diagnosis not present

## 2019-11-02 DIAGNOSIS — N951 Menopausal and female climacteric states: Secondary | ICD-10-CM | POA: Diagnosis not present

## 2019-11-02 DIAGNOSIS — Z79899 Other long term (current) drug therapy: Secondary | ICD-10-CM | POA: Diagnosis not present

## 2019-11-23 DIAGNOSIS — M79671 Pain in right foot: Secondary | ICD-10-CM | POA: Diagnosis not present

## 2019-11-23 DIAGNOSIS — M722 Plantar fascial fibromatosis: Secondary | ICD-10-CM | POA: Diagnosis not present

## 2019-12-11 DIAGNOSIS — M1710 Unilateral primary osteoarthritis, unspecified knee: Secondary | ICD-10-CM | POA: Diagnosis not present

## 2019-12-11 DIAGNOSIS — I10 Essential (primary) hypertension: Secondary | ICD-10-CM | POA: Diagnosis not present

## 2019-12-11 DIAGNOSIS — M545 Low back pain: Secondary | ICD-10-CM | POA: Diagnosis not present

## 2019-12-27 ENCOUNTER — Ambulatory Visit: Payer: Medicare HMO | Admitting: Sports Medicine

## 2020-01-11 DIAGNOSIS — R600 Localized edema: Secondary | ICD-10-CM | POA: Diagnosis not present

## 2020-01-11 DIAGNOSIS — M1710 Unilateral primary osteoarthritis, unspecified knee: Secondary | ICD-10-CM | POA: Diagnosis not present

## 2020-01-11 DIAGNOSIS — R252 Cramp and spasm: Secondary | ICD-10-CM | POA: Diagnosis not present

## 2020-01-11 DIAGNOSIS — I10 Essential (primary) hypertension: Secondary | ICD-10-CM | POA: Diagnosis not present

## 2020-01-30 DIAGNOSIS — Z1231 Encounter for screening mammogram for malignant neoplasm of breast: Secondary | ICD-10-CM | POA: Diagnosis not present

## 2020-01-30 DIAGNOSIS — E785 Hyperlipidemia, unspecified: Secondary | ICD-10-CM | POA: Diagnosis not present

## 2020-01-30 DIAGNOSIS — Z0000 Encounter for general adult medical examination without abnormal findings: Secondary | ICD-10-CM | POA: Diagnosis not present

## 2020-01-30 DIAGNOSIS — Z9181 History of falling: Secondary | ICD-10-CM | POA: Diagnosis not present

## 2020-01-30 DIAGNOSIS — Z139 Encounter for screening, unspecified: Secondary | ICD-10-CM | POA: Diagnosis not present

## 2020-02-15 DIAGNOSIS — J309 Allergic rhinitis, unspecified: Secondary | ICD-10-CM | POA: Diagnosis not present

## 2020-02-15 DIAGNOSIS — R600 Localized edema: Secondary | ICD-10-CM | POA: Diagnosis not present

## 2020-02-15 DIAGNOSIS — I10 Essential (primary) hypertension: Secondary | ICD-10-CM | POA: Diagnosis not present

## 2020-02-15 DIAGNOSIS — E538 Deficiency of other specified B group vitamins: Secondary | ICD-10-CM | POA: Diagnosis not present

## 2020-02-15 DIAGNOSIS — R5383 Other fatigue: Secondary | ICD-10-CM | POA: Diagnosis not present

## 2020-02-15 DIAGNOSIS — E785 Hyperlipidemia, unspecified: Secondary | ICD-10-CM | POA: Diagnosis not present

## 2020-03-21 DIAGNOSIS — Z1231 Encounter for screening mammogram for malignant neoplasm of breast: Secondary | ICD-10-CM | POA: Diagnosis not present

## 2020-05-02 DIAGNOSIS — L570 Actinic keratosis: Secondary | ICD-10-CM | POA: Diagnosis not present

## 2020-05-02 DIAGNOSIS — D692 Other nonthrombocytopenic purpura: Secondary | ICD-10-CM | POA: Diagnosis not present

## 2020-05-02 DIAGNOSIS — L814 Other melanin hyperpigmentation: Secondary | ICD-10-CM | POA: Diagnosis not present

## 2020-05-02 DIAGNOSIS — D225 Melanocytic nevi of trunk: Secondary | ICD-10-CM | POA: Diagnosis not present

## 2020-05-02 DIAGNOSIS — L820 Inflamed seborrheic keratosis: Secondary | ICD-10-CM | POA: Diagnosis not present

## 2020-05-02 DIAGNOSIS — L821 Other seborrheic keratosis: Secondary | ICD-10-CM | POA: Diagnosis not present

## 2020-05-16 DIAGNOSIS — J309 Allergic rhinitis, unspecified: Secondary | ICD-10-CM | POA: Diagnosis not present

## 2020-05-16 DIAGNOSIS — R600 Localized edema: Secondary | ICD-10-CM | POA: Diagnosis not present

## 2020-05-16 DIAGNOSIS — M545 Low back pain: Secondary | ICD-10-CM | POA: Diagnosis not present

## 2020-05-16 DIAGNOSIS — I10 Essential (primary) hypertension: Secondary | ICD-10-CM | POA: Diagnosis not present

## 2020-05-16 DIAGNOSIS — E785 Hyperlipidemia, unspecified: Secondary | ICD-10-CM | POA: Diagnosis not present

## 2020-06-25 DIAGNOSIS — H2703 Aphakia, bilateral: Secondary | ICD-10-CM | POA: Diagnosis not present

## 2020-07-15 DIAGNOSIS — H1010 Acute atopic conjunctivitis, unspecified eye: Secondary | ICD-10-CM | POA: Diagnosis not present

## 2020-07-15 DIAGNOSIS — H00019 Hordeolum externum unspecified eye, unspecified eyelid: Secondary | ICD-10-CM | POA: Diagnosis not present

## 2020-07-22 DIAGNOSIS — S81812D Laceration without foreign body, left lower leg, subsequent encounter: Secondary | ICD-10-CM | POA: Diagnosis not present

## 2020-07-30 DIAGNOSIS — J309 Allergic rhinitis, unspecified: Secondary | ICD-10-CM | POA: Diagnosis not present

## 2020-07-30 DIAGNOSIS — J040 Acute laryngitis: Secondary | ICD-10-CM | POA: Diagnosis not present

## 2020-08-02 DIAGNOSIS — J019 Acute sinusitis, unspecified: Secondary | ICD-10-CM | POA: Diagnosis not present

## 2020-08-02 DIAGNOSIS — J040 Acute laryngitis: Secondary | ICD-10-CM | POA: Diagnosis not present

## 2020-08-15 DIAGNOSIS — Z79899 Other long term (current) drug therapy: Secondary | ICD-10-CM | POA: Diagnosis not present

## 2020-08-15 DIAGNOSIS — E785 Hyperlipidemia, unspecified: Secondary | ICD-10-CM | POA: Diagnosis not present

## 2020-08-15 DIAGNOSIS — E559 Vitamin D deficiency, unspecified: Secondary | ICD-10-CM | POA: Diagnosis not present

## 2020-08-15 DIAGNOSIS — R7303 Prediabetes: Secondary | ICD-10-CM | POA: Diagnosis not present

## 2020-08-15 DIAGNOSIS — I10 Essential (primary) hypertension: Secondary | ICD-10-CM | POA: Diagnosis not present

## 2020-08-15 DIAGNOSIS — Z23 Encounter for immunization: Secondary | ICD-10-CM | POA: Diagnosis not present

## 2020-08-31 DIAGNOSIS — C801 Malignant (primary) neoplasm, unspecified: Secondary | ICD-10-CM | POA: Insufficient documentation

## 2020-08-31 HISTORY — PX: BREAST LUMPECTOMY WITH NEEDLE LOCALIZATION AND AXILLARY SENTINEL LYMPH NODE BX: SHX5760

## 2020-08-31 HISTORY — DX: Malignant (primary) neoplasm, unspecified: C80.1

## 2021-02-28 DIAGNOSIS — M25512 Pain in left shoulder: Secondary | ICD-10-CM | POA: Diagnosis not present

## 2021-02-28 DIAGNOSIS — R293 Abnormal posture: Secondary | ICD-10-CM | POA: Diagnosis not present

## 2021-02-28 DIAGNOSIS — M542 Cervicalgia: Secondary | ICD-10-CM | POA: Diagnosis not present

## 2021-02-28 DIAGNOSIS — M19012 Primary osteoarthritis, left shoulder: Secondary | ICD-10-CM | POA: Diagnosis not present

## 2021-02-28 DIAGNOSIS — M79622 Pain in left upper arm: Secondary | ICD-10-CM | POA: Diagnosis not present

## 2021-02-28 DIAGNOSIS — M6281 Muscle weakness (generalized): Secondary | ICD-10-CM | POA: Diagnosis not present

## 2021-02-28 DIAGNOSIS — M2560 Stiffness of unspecified joint, not elsewhere classified: Secondary | ICD-10-CM | POA: Diagnosis not present

## 2021-03-04 DIAGNOSIS — R293 Abnormal posture: Secondary | ICD-10-CM | POA: Diagnosis not present

## 2021-03-04 DIAGNOSIS — M6281 Muscle weakness (generalized): Secondary | ICD-10-CM | POA: Diagnosis not present

## 2021-03-04 DIAGNOSIS — M2560 Stiffness of unspecified joint, not elsewhere classified: Secondary | ICD-10-CM | POA: Diagnosis not present

## 2021-03-04 DIAGNOSIS — M542 Cervicalgia: Secondary | ICD-10-CM | POA: Diagnosis not present

## 2021-03-04 DIAGNOSIS — M25512 Pain in left shoulder: Secondary | ICD-10-CM | POA: Diagnosis not present

## 2021-03-04 DIAGNOSIS — M19012 Primary osteoarthritis, left shoulder: Secondary | ICD-10-CM | POA: Diagnosis not present

## 2021-03-04 DIAGNOSIS — M79622 Pain in left upper arm: Secondary | ICD-10-CM | POA: Diagnosis not present

## 2021-03-11 DIAGNOSIS — M79622 Pain in left upper arm: Secondary | ICD-10-CM | POA: Diagnosis not present

## 2021-03-11 DIAGNOSIS — M6281 Muscle weakness (generalized): Secondary | ICD-10-CM | POA: Diagnosis not present

## 2021-03-11 DIAGNOSIS — M19012 Primary osteoarthritis, left shoulder: Secondary | ICD-10-CM | POA: Diagnosis not present

## 2021-03-11 DIAGNOSIS — M2560 Stiffness of unspecified joint, not elsewhere classified: Secondary | ICD-10-CM | POA: Diagnosis not present

## 2021-03-11 DIAGNOSIS — M25512 Pain in left shoulder: Secondary | ICD-10-CM | POA: Diagnosis not present

## 2021-03-11 DIAGNOSIS — R293 Abnormal posture: Secondary | ICD-10-CM | POA: Diagnosis not present

## 2021-03-11 DIAGNOSIS — M542 Cervicalgia: Secondary | ICD-10-CM | POA: Diagnosis not present

## 2021-03-25 DIAGNOSIS — I10 Essential (primary) hypertension: Secondary | ICD-10-CM | POA: Diagnosis not present

## 2021-03-25 DIAGNOSIS — Z1231 Encounter for screening mammogram for malignant neoplasm of breast: Secondary | ICD-10-CM | POA: Diagnosis not present

## 2021-03-25 DIAGNOSIS — J309 Allergic rhinitis, unspecified: Secondary | ICD-10-CM | POA: Diagnosis not present

## 2021-03-25 DIAGNOSIS — J069 Acute upper respiratory infection, unspecified: Secondary | ICD-10-CM | POA: Diagnosis not present

## 2021-04-07 DIAGNOSIS — M2560 Stiffness of unspecified joint, not elsewhere classified: Secondary | ICD-10-CM | POA: Diagnosis not present

## 2021-04-07 DIAGNOSIS — M25512 Pain in left shoulder: Secondary | ICD-10-CM | POA: Diagnosis not present

## 2021-04-07 DIAGNOSIS — M542 Cervicalgia: Secondary | ICD-10-CM | POA: Diagnosis not present

## 2021-04-07 DIAGNOSIS — M6281 Muscle weakness (generalized): Secondary | ICD-10-CM | POA: Diagnosis not present

## 2021-04-07 DIAGNOSIS — M79622 Pain in left upper arm: Secondary | ICD-10-CM | POA: Diagnosis not present

## 2021-04-07 DIAGNOSIS — M19012 Primary osteoarthritis, left shoulder: Secondary | ICD-10-CM | POA: Diagnosis not present

## 2021-04-07 DIAGNOSIS — R293 Abnormal posture: Secondary | ICD-10-CM | POA: Diagnosis not present

## 2021-04-24 ENCOUNTER — Other Ambulatory Visit: Payer: Self-pay | Admitting: Internal Medicine

## 2021-04-24 DIAGNOSIS — E559 Vitamin D deficiency, unspecified: Secondary | ICD-10-CM | POA: Diagnosis not present

## 2021-04-24 DIAGNOSIS — R7303 Prediabetes: Secondary | ICD-10-CM | POA: Diagnosis not present

## 2021-04-24 DIAGNOSIS — K219 Gastro-esophageal reflux disease without esophagitis: Secondary | ICD-10-CM | POA: Diagnosis not present

## 2021-04-24 DIAGNOSIS — E785 Hyperlipidemia, unspecified: Secondary | ICD-10-CM | POA: Diagnosis not present

## 2021-04-24 DIAGNOSIS — I10 Essential (primary) hypertension: Secondary | ICD-10-CM | POA: Diagnosis not present

## 2021-04-24 DIAGNOSIS — N6489 Other specified disorders of breast: Secondary | ICD-10-CM | POA: Diagnosis not present

## 2021-04-24 DIAGNOSIS — N3281 Overactive bladder: Secondary | ICD-10-CM | POA: Diagnosis not present

## 2021-04-24 DIAGNOSIS — M47812 Spondylosis without myelopathy or radiculopathy, cervical region: Secondary | ICD-10-CM | POA: Diagnosis not present

## 2021-04-24 DIAGNOSIS — R922 Inconclusive mammogram: Secondary | ICD-10-CM | POA: Diagnosis not present

## 2021-04-24 DIAGNOSIS — J309 Allergic rhinitis, unspecified: Secondary | ICD-10-CM | POA: Diagnosis not present

## 2021-04-24 DIAGNOSIS — Z79899 Other long term (current) drug therapy: Secondary | ICD-10-CM | POA: Diagnosis not present

## 2021-04-24 DIAGNOSIS — R928 Other abnormal and inconclusive findings on diagnostic imaging of breast: Secondary | ICD-10-CM | POA: Diagnosis not present

## 2021-04-30 ENCOUNTER — Ambulatory Visit
Admission: RE | Admit: 2021-04-30 | Discharge: 2021-04-30 | Disposition: A | Discharge status: Home / Self Care | Payer: Medicare Other | Source: Ambulatory Visit | Attending: Internal Medicine | Admitting: Internal Medicine

## 2021-04-30 ENCOUNTER — Other Ambulatory Visit: Payer: Self-pay

## 2021-04-30 ENCOUNTER — Other Ambulatory Visit: Payer: Self-pay | Admitting: Radiology

## 2021-04-30 DIAGNOSIS — C50412 Malignant neoplasm of upper-outer quadrant of left female breast: Secondary | ICD-10-CM

## 2021-04-30 DIAGNOSIS — N6489 Other specified disorders of breast: Secondary | ICD-10-CM

## 2021-04-30 DIAGNOSIS — N6321 Unspecified lump in the left breast, upper outer quadrant: Secondary | ICD-10-CM | POA: Diagnosis not present

## 2021-04-30 HISTORY — DX: Malignant neoplasm of upper-outer quadrant of left female breast: C50.412

## 2021-05-06 DIAGNOSIS — Z170 Estrogen receptor positive status [ER+]: Secondary | ICD-10-CM | POA: Diagnosis not present

## 2021-05-06 DIAGNOSIS — C50412 Malignant neoplasm of upper-outer quadrant of left female breast: Secondary | ICD-10-CM | POA: Diagnosis not present

## 2021-05-15 DIAGNOSIS — N6321 Unspecified lump in the left breast, upper outer quadrant: Secondary | ICD-10-CM | POA: Diagnosis not present

## 2021-05-15 DIAGNOSIS — R7303 Prediabetes: Secondary | ICD-10-CM | POA: Diagnosis not present

## 2021-05-15 DIAGNOSIS — Z170 Estrogen receptor positive status [ER+]: Secondary | ICD-10-CM | POA: Diagnosis not present

## 2021-05-15 DIAGNOSIS — C50412 Malignant neoplasm of upper-outer quadrant of left female breast: Secondary | ICD-10-CM | POA: Diagnosis not present

## 2021-05-23 DIAGNOSIS — Z170 Estrogen receptor positive status [ER+]: Secondary | ICD-10-CM | POA: Diagnosis not present

## 2021-05-23 DIAGNOSIS — C50412 Malignant neoplasm of upper-outer quadrant of left female breast: Secondary | ICD-10-CM | POA: Diagnosis not present

## 2021-05-26 ENCOUNTER — Telehealth: Payer: Self-pay | Admitting: Oncology

## 2021-05-26 NOTE — Telephone Encounter (Signed)
Scheduled appt per 9/26 referral. Pt is aware of appt date and time.  

## 2021-05-29 ENCOUNTER — Telehealth: Payer: Self-pay | Admitting: Oncology

## 2021-05-29 NOTE — Telephone Encounter (Signed)
Patient requested Dr Hinton Rao - Scheduled w/Dr Bobby Rumpf.  Patient referred by Dr Orrin Brigham for Breast CA.  Appt made for 06/16/21 Consult 3:00 pm

## 2021-06-10 ENCOUNTER — Other Ambulatory Visit: Payer: Medicare Other

## 2021-06-10 ENCOUNTER — Ambulatory Visit: Payer: Medicare Other | Admitting: Oncology

## 2021-06-13 NOTE — Progress Notes (Signed)
[facility]  [address] [place],  [place]  [address] [phone]  Clinic Day: 06/16/21  Referring physician: Nicholos Johns, MD  This document serves as a record of services personally performed by Hosie Poisson, MD. It was created on their behalf by Curry,Lauren E, a trained medical scribe. The creation of this record is based on the scribe's personal observations and the provider's statements to them.  CHIEF COMPLAINT:  CC: Left breast cancer  Current Treatment: Hormonal therapy   HISTORY OF PRESENT ILLNESS:  Meghan Carpenter is a 75 y.o. female referred by Dr. Orrin Brigham for the evaluation and treatment of breast cancer. This was found on screening bilateral mammogram from July 2022, which revealed a possible distortion in the left breast. Unilateral diagnostic left mammogram and ultrasound was pursued and confirmed a suspicious mass with distortion in the upper-outer left breast without a definite sonographic correlate. There was no evidence of left axillary lymphadenopathy. 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 HER2 was negative (1+). Estrogen and progesterone receptors were both positive at 95%. Ki67 was 5%. She was referred to Dr. Lilia Pro and underwent left breast lumpectomy and left axillary sentinel lymph node biopsy on September 15th. 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 All margins negative for invasive carcinoma and DCIS. One regional lymph node was negative for malignancy (0/1).  INTERVAL HISTORY:  Meghan Carpenter comes in healing well 1 month  postop and without complaint.  She does get yearly mammograms.  She had menarche at age 24 and menopause at age 25.  She was on hormone replacement therapy for 3-4 years.She had a hysterectomy and bilateral salping oophorectomy in  2000 for fibroids. She has had 2 live births, the first at age 82. The radiation oncologist does not feel radiotherapy is necessary.  She had a bone density scan in June of 2020 which was normal. She is up to date on colonoscopy.  Her  appetite is good.  She denies fever, chills or other signs of infection.  She denies nausea, vomiting, bowel issues, or abdominal pain.  She denies sore throat, cough, dyspnea, or chest pain.  REVIEW OF SYSTEMS:  Review of Systems  Constitutional: Negative.   HENT:  Negative.    Eyes: Negative.   Respiratory: Negative.    Cardiovascular: Negative.   Gastrointestinal: Negative.   Endocrine: Negative.   Genitourinary: Negative.    Musculoskeletal: Negative.   Neurological: Negative.   Hematological: Negative.   Psychiatric/Behavioral: Negative.      VITALS:  Blood pressure (!) 181/82, pulse 71, temperature 98 F (36.7 C), resp. rate 18, height _0  (1.727 m), weight 250 lb 4.8 oz (113.5 kg), SpO2 98 %.  Wt Readings from Last 3 Encounters:  06/16/21 250 lb 4.8 oz (113.5 kg)  03/07/18 239 lb (108.4 kg)  02/21/18 239 lb 3.2 oz (108.5 kg)    Body mass index is 38.06 kg/m.  Performance status (ECOG): 0 - Asymptomatic  PHYSICAL EXAM:  Physical Exam Constitutional:      Appearance: Normal appearance.  HENT:     Head: Normocephalic and atraumatic.     Nose: Nose normal.     Mouth/Throat:     Pharynx: Oropharynx is clear.  Eyes:     Extraocular Movements: Extraocular movements intact.     Conjunctiva/sclera: Conjunctivae normal.     Pupils:  Pupils are equal, round, and reactive to light.  Cardiovascular:     Rate and Rhythm: Normal rate and regular rhythm.     Heart sounds: Normal heart sounds.  Pulmonary:     Effort: Pulmonary effort is normal.     Breath sounds: Normal breath sounds.  Chest:  Breasts:    Right: Normal.     Left: Skin change present.     Comments: She has a healing incision in the upper outer quadrant of the left breast and  the left axilla.  She has slight erythema of the area. Abdominal:     General: Bowel sounds are normal.     Palpations: Abdomen is soft.  Musculoskeletal:        General: Normal range of motion.     Cervical back: Normal range of motion and neck supple.  Lymphadenopathy:     Upper Body:     Right upper body: No supraclavicular or axillary adenopathy.     Left upper body: No supraclavicular or axillary adenopathy.  Skin:    General: Skin is warm and dry.  Neurological:     General: No focal deficit present.     Mental Status: She is alert and oriented to person, place, and time.  Psychiatric:        Mood and Affect: Mood normal.        Behavior: Behavior normal.        Thought Content: Thought content normal.        Judgment: Judgment normal.   LABS:   CBC Latest Ref Rng & Units 06/16/2021 02/20/2011  WBC - 11.7 -  Hemoglobin 12.0 - 16.0 12.7 14.6  Hematocrit 36 - 46 38 -  Platelets 150 - 399 258 -   CMP Latest Ref Rng & Units 06/16/2021  BUN 4 - 21 14  Creatinine 0.5 - 1.1 0.8  Sodium 137 - 147 139  Potassium 3.4 - 5.3 4.1  Chloride 99 - 108 104  CO2 13 - 22 30(A)  Calcium 8.7 - 10.7 9.3  Alkaline Phos 25 - 125 110  AST 13 - 35 33  ALT 7 - 35 23    STUDIES:  No results found.   EXAM: 03/25/2021 DIGITAL SCREENING BILATERAL MAMMOGRAM WITH TOMOSYNTHESIS AND CAD  TECHNIQUE: Bilateral screening digital craniocaudal and mediolateral oblique mammograms were obtained. Bilateral screening digital breast tomosynthesis was performed. The images were evaluated with computer-aided detection.  COMPARISON: Previous exam(s).  ACR Breast Density Category b: There are scattered areas of fibroglandular density.  FINDINGS: In the left breast, possible distortion warrants further evaluation. In the right breast, no findings suspicious for malignancy.  IMPRESSION: Further evaluation is suggested for possible distortion in the left breast.  EXAM: 04/24/2021 DIGITAL  DIAGNOSTIC UNILATERAL LEFT MAMMOGRAM WITH TOMOSYNTHESIS AND CAD; ULTRASOUND LEFT BREAST  TECHNIQUE: Left digital diagnostic mammography and breast tomosynthesis was performed. The images were evaluated with computer-aided detection.; Targeted ultrasound examination of the left breast was performed.  COMPARISON: Previous exam(s).  ACR Breast Density Category b: There are scattered areas of fibroglandular density.  FINDINGS: Spot compression tomosynthesis images through the upper outer left breast demonstrates a persistent small mass with distortion. Ultrasound targeted to the upper-outer quadrant of the left breast demonstrates no definite correlate for the area of distortion. Ultrasound of the left axilla demonstrates multiple normal-appearing lymph nodes.  IMPRESSION: 1. There is a suspicious mass with distortion in the upper-outer left breast without a definite sonographic correlate. 2. No evidence of left axillary  lymphadenopathy.  SURGICAL PATHOLOGY: 04/30/2021 Breast, left, needle core biopsy, upper outer:  Invasive ductal carcinoma  Ductal carcinoma in situ HER2: Negative (1+) Estrogen receptor: Positive 95% Progesterone receptor: Positive 95% Ki67: 5%  SURGICAL PATHOLOGY: 05/15/2021 Breast, lumpectomy, left:  Invasive ductal carcinoma, grade 1, and DCIS, adjacent to biopsy site  All margins negative for invasive carcinoma and DCIS Lymph node, sentinel, biopsy, left axilla:  One regional lymph node negative for tumor (0/1) Tumor size: 9 mm   HISTORY:   Allergies:  Allergies  Allergen Reactions   Sulfa Antibiotics Nausea Only    Current Medications: Current Outpatient Medications  Medication Sig Dispense Refill   cetirizine (ZYRTEC) 10 MG tablet Take 10 mg by mouth daily.     cloNIDine (CATAPRES) 0.1 MG tablet 0.37m 1/2 tablet daily     Coenzyme Q10 100 MG TABS Take 100 mg by mouth 2 (two) times daily.     diclofenac Sodium (VOLTAREN) 1 % GEL Apply  topically as needed.     ezetimibe (ZETIA) 10 MG tablet Take 10 mg by mouth daily.     meloxicam (MOBIC) 7.5 MG tablet Take 7.5 mg by mouth 2 (two) times daily.     metoprolol succinate (TOPROL-XL) 50 MG 24 hr tablet Take 50 mg by mouth daily. Takes 1/2 tablet twice a day     montelukast (SINGULAIR) 10 MG tablet Take 1 tablet by mouth daily.     OVER THE COUNTER MEDICATION Hempvana cream as needed for arthritis\     Potassium 99 MG TABS Take by mouth as needed.     Vitamin D, Ergocalciferol, (DRISDOL) 1.25 MG (50000 UNIT) CAPS capsule Take 50,000 Units by mouth once a week.     anastrozole (ARIMIDEX) 1 MG tablet Take 1 tablet (1 mg total) by mouth daily. 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 She is healing well and will not need radiation or chemotherapy.  I do recommend hormonal therapy and discussed the rationale and potential toxicities with them.  I recommend an aromatase inhibitor and they agree, so I will send in a prescription for anastrazole 1 mg daily and will plan to continue this for 5 years.  2.   She has just slight erythema of the left breast with a slightly elevated white blood count.  I feel these are normal postop changes but have advised her to monitor this and report if worsening.   Plan: This is a pleasant 75year old female recently diagnosed with stage IA left breast cancer, status post lumpectomy with Dr. MLilia Pro As her hormone receptors are highly positive, she would benefit from endocrine therapy with an aromatase inhibitor such as anastrozole., and we will initiate that now.  I have recommended a current bone density scan  and will get that scheduled.  I answered their questions, and they understand and agree with this plan of care.  I will see her back in 3 months for follow up exam but she knows she can call for any concerns regarding her breast cancer.  Thank you for the opportunity to participate in the care of your patients  I provided 45 minutes of face-to-face time during this this encounter and > 50% was spent counseling as documented under my assessment and plan.    Derwood Kaplan, MD [facility] AT [facility] [address] [place] [address] Dept: [phone] Dept Fax: [phone]   I, Rita Ohara, am acting as scribe for Derwood Kaplan, MD  I have reviewed this report as typed by the medical scribe, and it is complete and accurate.  Derwood Kaplan, MD

## 2021-06-16 ENCOUNTER — Inpatient Hospital Stay: Payer: Medicare Other | Attending: Oncology | Admitting: Oncology

## 2021-06-16 ENCOUNTER — Inpatient Hospital Stay: Payer: Medicare Other

## 2021-06-16 ENCOUNTER — Other Ambulatory Visit: Payer: Self-pay | Admitting: Oncology

## 2021-06-16 ENCOUNTER — Other Ambulatory Visit: Payer: Self-pay | Admitting: Hematology and Oncology

## 2021-06-16 ENCOUNTER — Encounter: Payer: Self-pay | Admitting: Oncology

## 2021-06-16 DIAGNOSIS — Z170 Estrogen receptor positive status [ER+]: Secondary | ICD-10-CM

## 2021-06-16 DIAGNOSIS — C50412 Malignant neoplasm of upper-outer quadrant of left female breast: Secondary | ICD-10-CM

## 2021-06-16 DIAGNOSIS — C50919 Malignant neoplasm of unspecified site of unspecified female breast: Secondary | ICD-10-CM | POA: Diagnosis not present

## 2021-06-16 DIAGNOSIS — D649 Anemia, unspecified: Secondary | ICD-10-CM | POA: Diagnosis not present

## 2021-06-16 LAB — CBC AND DIFFERENTIAL
HCT: 38 (ref 36–46)
Hemoglobin: 12.7 (ref 12.0–16.0)
Neutrophils Absolute: 6.44
Platelets: 258 (ref 150–399)
WBC: 11.7

## 2021-06-16 LAB — COMPREHENSIVE METABOLIC PANEL
Albumin: 4.1 (ref 3.5–5.0)
Calcium: 9.3 (ref 8.7–10.7)

## 2021-06-16 LAB — HEPATIC FUNCTION PANEL
ALT: 23 (ref 7–35)
AST: 33 (ref 13–35)
Alkaline Phosphatase: 110 (ref 25–125)
Bilirubin, Total: 0.3

## 2021-06-16 LAB — BASIC METABOLIC PANEL
BUN: 14 (ref 4–21)
CO2: 30 — AB (ref 13–22)
Chloride: 104 (ref 99–108)
Creatinine: 0.8 (ref 0.5–1.1)
Glucose: 121
Potassium: 4.1 (ref 3.4–5.3)
Sodium: 139 (ref 137–147)

## 2021-06-16 LAB — CBC: RBC: 4.83 (ref 3.87–5.11)

## 2021-06-16 MED ORDER — ANASTROZOLE 1 MG PO TABS: 1.0000 mg | ORAL_TABLET | Freq: Every day | ORAL | 5 refills | Status: DC

## 2021-06-17 ENCOUNTER — Telehealth: Payer: Self-pay | Admitting: Oncology

## 2021-06-17 DIAGNOSIS — Z170 Estrogen receptor positive status [ER+]: Secondary | ICD-10-CM | POA: Diagnosis not present

## 2021-06-17 DIAGNOSIS — C50412 Malignant neoplasm of upper-outer quadrant of left female breast: Secondary | ICD-10-CM | POA: Diagnosis not present

## 2021-06-17 NOTE — Telephone Encounter (Signed)
06/17/21 spoke with patient and scheduled all appts.

## 2021-06-18 ENCOUNTER — Telehealth: Payer: Self-pay

## 2021-06-18 NOTE — Telephone Encounter (Signed)
-----   Message from Derwood Kaplan, MD sent at 06/17/2021  3:42 PM EDT ----- Regarding: RE: When to start meds I ordered the anastrazole yest.  She can start anytime, the bone density does not change rapidly ----- Message ----- From: Belva Chimes, LPN Sent: [id number]  11:11 AM EDT To: Derwood Kaplan, MD Subject: FW: When to start meds                         Unsure what medication it is? Do you happen to know what medications she is to start? ----- Message ----- From: Neva Seat Sent: 06/17/2021  10:33 AM EDT To: Belva Chimes, LPN Subject: When to start meds                             Spoke with patient about next appts and she was confused about when she is to start new medication?Before or after bone density.Please call.Thanks

## 2021-06-20 ENCOUNTER — Telehealth: Payer: Self-pay

## 2021-06-20 NOTE — Telephone Encounter (Signed)
Patient notified

## 2021-06-20 NOTE — Telephone Encounter (Signed)
-----   Message from Derwood Kaplan, MD sent at 06/17/2021  3:42 PM EDT ----- Regarding: RE: When to start meds I ordered the anastrazole yest.  She can start anytime, the bone density does not change rapidly ----- Message ----- From: Belva Chimes, LPN Sent: 98/06/2547  11:11 AM EDT To: Derwood Kaplan, MD Subject: FW: When to start meds                         Unsure what medication it is? Do you happen to know what medications she is to start? ----- Message ----- From: Neva Seat Sent: 06/17/2021  10:33 AM EDT To: Belva Chimes, LPN Subject: When to start meds                             Spoke with patient about next appts and she was confused about when she is to start new medication?Before or after bone density.Please call.Thanks

## 2021-06-22 ENCOUNTER — Other Ambulatory Visit: Payer: Self-pay | Admitting: Oncology

## 2021-06-22 DIAGNOSIS — Z170 Estrogen receptor positive status [ER+]: Secondary | ICD-10-CM

## 2021-06-22 DIAGNOSIS — C50412 Malignant neoplasm of upper-outer quadrant of left female breast: Secondary | ICD-10-CM

## 2021-07-02 ENCOUNTER — Telehealth: Payer: Self-pay

## 2021-07-02 NOTE — Telephone Encounter (Signed)
Pt notified & verbalized understanding.   RE: Arimidex Received: Today Meghan Kaplan, MD  Dairl Ponder, RN Stop the med.  We can discuss other alternatives at her next appt but she may choose not to take any hormone blocker since her risk for recurrence is low .     I spoke with pt. She states, "I cant take that medication. I can't get around. I have arthritis and my joints are killing me. My shoulder, hip and knee joints. She told me that was a side effect".

## 2021-07-08 DIAGNOSIS — H2703 Aphakia, bilateral: Secondary | ICD-10-CM | POA: Diagnosis not present

## 2021-07-10 DIAGNOSIS — L570 Actinic keratosis: Secondary | ICD-10-CM | POA: Diagnosis not present

## 2021-07-10 DIAGNOSIS — L821 Other seborrheic keratosis: Secondary | ICD-10-CM | POA: Diagnosis not present

## 2021-07-10 DIAGNOSIS — L814 Other melanin hyperpigmentation: Secondary | ICD-10-CM | POA: Diagnosis not present

## 2021-07-10 DIAGNOSIS — D225 Melanocytic nevi of trunk: Secondary | ICD-10-CM | POA: Diagnosis not present

## 2021-07-15 DIAGNOSIS — J309 Allergic rhinitis, unspecified: Secondary | ICD-10-CM | POA: Diagnosis not present

## 2021-07-15 DIAGNOSIS — E559 Vitamin D deficiency, unspecified: Secondary | ICD-10-CM | POA: Diagnosis not present

## 2021-07-15 DIAGNOSIS — M47812 Spondylosis without myelopathy or radiculopathy, cervical region: Secondary | ICD-10-CM | POA: Diagnosis not present

## 2021-07-15 DIAGNOSIS — Z23 Encounter for immunization: Secondary | ICD-10-CM | POA: Diagnosis not present

## 2021-07-15 DIAGNOSIS — M25561 Pain in right knee: Secondary | ICD-10-CM | POA: Diagnosis not present

## 2021-07-15 DIAGNOSIS — N3281 Overactive bladder: Secondary | ICD-10-CM | POA: Diagnosis not present

## 2021-07-15 DIAGNOSIS — R7303 Prediabetes: Secondary | ICD-10-CM | POA: Diagnosis not present

## 2021-07-15 DIAGNOSIS — K219 Gastro-esophageal reflux disease without esophagitis: Secondary | ICD-10-CM | POA: Diagnosis not present

## 2021-07-15 DIAGNOSIS — M25461 Effusion, right knee: Secondary | ICD-10-CM | POA: Diagnosis not present

## 2021-07-15 DIAGNOSIS — I10 Essential (primary) hypertension: Secondary | ICD-10-CM | POA: Diagnosis not present

## 2021-07-15 DIAGNOSIS — E785 Hyperlipidemia, unspecified: Secondary | ICD-10-CM | POA: Diagnosis not present

## 2021-09-03 DIAGNOSIS — I10 Essential (primary) hypertension: Secondary | ICD-10-CM | POA: Diagnosis not present

## 2021-09-03 DIAGNOSIS — R0602 Shortness of breath: Secondary | ICD-10-CM | POA: Diagnosis not present

## 2021-09-03 DIAGNOSIS — R5383 Other fatigue: Secondary | ICD-10-CM | POA: Diagnosis not present

## 2021-09-05 DIAGNOSIS — Z13820 Encounter for screening for osteoporosis: Secondary | ICD-10-CM | POA: Diagnosis not present

## 2021-09-05 DIAGNOSIS — M8589 Other specified disorders of bone density and structure, multiple sites: Secondary | ICD-10-CM | POA: Diagnosis not present

## 2021-09-10 NOTE — Progress Notes (Signed)
[facility]  [address] [place],  [place]  [address] [phone]  Clinic Day: 09/16/2021  Referring physician: Lucianne Lei, MD  This document serves as a record of services personally performed by Gery Pray, MD. It was created on their behalf by Curry,Lauren E, a trained medical scribe. The creation of this record is based on the scribe's personal observations and the provider's statements to them.  CHIEF COMPLAINT:  CC: Stage IA left breast cancer  Current Treatment: Hormonal therapy   HISTORY OF PRESENT ILLNESS:  Meghan Carpenter is a 76 y.o. 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 This was found on screening bilateral mammogram from July 2022, which revealed a possible distortion in the left breast. Unilateral diagnostic left mammogram and ultrasound was pursued and confirmed a suspicious mass with distortion in the upper-outer left breast without a definite sonographic correlate. There was no evidence of left axillary lymphadenopathy. 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 HER2 was negative (1+). Estrogen and progesterone receptors were both positive at 95%. Ki67 was 5%. She was referred to Dr. Lequita Halt and underwent left breast lumpectomy and left axillary sentinel lymph node biopsy on September 15th. 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 All margins negative for invasive carcinoma and DCIS. One regional lymph node was negative for malignancy (0/1). She was placed on endocrine therapy with anastrozole in October 2022.  She had menarche at age 72 and menopause at age 76.  She was on hormone replacement therapy for 3-4 years. She had a hysterectomy and bilateral salping oophorectomy  in 2000 for fibroids. She has had 2 live births, the first at age 25. The radiation oncologist does not feel radiotherapy is necessary. She is up to date on colonoscopy.  INTERVAL HISTORY:  Meghan Carpenter is here for routine follow up and states that she is doing well. She was placed on anastrozole daily in October, but was not able to tolerate this due to severe muscle cramps and arthralgias. She has been off anastrozole for a couple months now. For the past month, she has continued to have soreness of the right thigh, and feels she may have "pulled something", and describes muscle cramps. She agrees this may not be related to the medication. Bone density from January 6th revealed a completely normal exam. Her  appetite is good, and her weight is stable since her last visit.  She denies fever, chills or other signs of infection.  She denies nausea, vomiting, bowel issues, or abdominal pain.  She denies sore throat, cough, dyspnea, or chest pain.  REVIEW OF SYSTEMS:  Review of Systems  Constitutional: Negative.  Negative for appetite change, chills, fatigue, fever and unexpected weight change.  HENT:  Negative.    Eyes: Negative.   Respiratory: Negative.  Negative for chest tightness, cough, hemoptysis, shortness of breath and wheezing.   Cardiovascular: Negative.  Negative for chest pain, leg swelling and palpitations.  Gastrointestinal: Negative.  Negative for abdominal distention, abdominal pain, blood in stool, constipation, diarrhea, nausea and vomiting.  Endocrine: Negative.   Genitourinary: Negative.  Negative for difficulty urinating, dysuria, frequency and hematuria.   Musculoskeletal:  Positive for arthralgias (of the bilateral knees, intermittent). Negative for back pain, flank pain, gait problem and myalgias.  Soreness/pain of the right thigh for the past month as well as muscle cramps  Skin: Negative.   Neurological: Negative.  Negative for dizziness, extremity weakness, gait problem,  headaches, light-headedness, numbness, seizures and speech difficulty.  Hematological: Negative.   Psychiatric/Behavioral: Negative.  Negative for depression and sleep disturbance. The patient is not nervous/anxious.     VITALS:  Blood pressure (!) 146/67, pulse (!) 55, temperature 97.6 F (36.4 C), temperature source Oral, resp. rate 20, height $RemoveBe'5\' 8"'YTRHvBmML$  (1.727 m), weight 249 lb 14.4 oz (113.4 kg), SpO2 97 %.  Wt Readings from Last 3 Encounters:  09/16/21 249 lb 14.4 oz (113.4 kg)  06/16/21 250 lb 4.8 oz (113.5 kg)  03/07/18 239 lb (108.4 kg)    Body mass index is 38 kg/m.  Performance status (ECOG): 1 - Symptomatic but completely ambulatory  PHYSICAL EXAM:  Physical Exam Constitutional:      General: She is not in acute distress.    Appearance: Normal appearance. She is normal weight.  HENT:     Head: Normocephalic and atraumatic.  Eyes:     General: No scleral icterus.    Extraocular Movements: Extraocular movements intact.     Conjunctiva/sclera: Conjunctivae normal.     Pupils: Pupils are equal, round, and reactive to light.  Cardiovascular:     Rate and Rhythm: Regular rhythm. Bradycardia present.     Pulses: Normal pulses.     Heart sounds: Normal heart sounds. No murmur heard.   No friction rub. No gallop.  Pulmonary:     Effort: Pulmonary effort is normal. No respiratory distress.     Breath sounds: Normal breath sounds.  Chest:     Comments: Well healed scar in the upper outer quadrant of the left breast and left axilla. No masses in either breast. Abdominal:     General: Bowel sounds are normal. There is no distension.     Palpations: Abdomen is soft. There is no hepatomegaly, splenomegaly or mass.     Tenderness: There is no abdominal tenderness.  Musculoskeletal:        General: Normal range of motion.     Cervical back: Normal range of motion and neck supple.     Right lower leg: Edema (mild) present.     Left lower leg: Edema (mild) present.     Comments:  Crepitation of the right knee  Lymphadenopathy:     Cervical: No cervical adenopathy.  Skin:    General: Skin is warm and dry.  Neurological:     General: No focal deficit present.     Mental Status: She is alert and oriented to person, place, and time. Mental status is at baseline.  Psychiatric:        Mood and Affect: Mood normal.        Behavior: Behavior normal.        Thought Content: Thought content normal.        Judgment: Judgment normal.   LABS:   CBC Latest Ref Rng & Units 06/16/2021 02/20/2011  WBC - 11.7 -  Hemoglobin 12.0 - 16.0 12.7 14.6  Hematocrit 36 - 46 38 -  Platelets 150 - 399 258 -   CMP Latest Ref Rng & Units 06/16/2021  BUN 4 - 21 14  Creatinine 0.5 - 1.1 0.8  Sodium 137 - 147 139  Potassium 3.4 - 5.3 4.1  Chloride 99 - 108 104  CO2 13 - 22 30(A)  Calcium 8.7 - 10.7 9.3  Alkaline Phos 25 -  125 110  AST 13 - 35 33  ALT 7 - 35 23    STUDIES:  No results found.    HISTORY:   Allergies:  Allergies  Allergen Reactions   Sulfa Antibiotics Nausea Only    Current Medications: Current Outpatient Medications  Medication Sig Dispense Refill   cetirizine (ZYRTEC) 10 MG tablet Take 10 mg by mouth daily.     cloNIDine (CATAPRES) 0.1 MG tablet 0.$RemoveBef'1mg'FvjPSVybWc$  1/2 tablet daily     Coenzyme Q10 100 MG TABS Take 100 mg by mouth 2 (two) times daily.     diclofenac Sodium (VOLTAREN) 1 % GEL Apply topically as needed.     ezetimibe (ZETIA) 10 MG tablet Take 10 mg by mouth daily.     furosemide (LASIX) 20 MG tablet Take 20 mg by mouth daily as needed.     meloxicam (MOBIC) 7.5 MG tablet Take 7.5 mg by mouth 2 (two) times daily.     metoprolol succinate (TOPROL-XL) 50 MG 24 hr tablet Take 50 mg by mouth daily. Takes 1/2 tablet twice a day     montelukast (SINGULAIR) 10 MG tablet Take 1 tablet by mouth daily.     OVER THE COUNTER MEDICATION Hempvana cream as needed for arthritis\     Potassium 99 MG TABS Take by mouth as needed.     valsartan-hydrochlorothiazide  (DIOVAN-HCT) 80-12.5 MG tablet Take by mouth.     Vitamin D, Ergocalciferol, (DRISDOL) 1.25 MG (50000 UNIT) CAPS capsule Take 50,000 Units by mouth once a week.     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 She is healing well and will not need radiation or chemotherapy.  She was placed on anastrozole 1 mg daily in October 2022, but did not tolerate. Therefore we will try her on letrozole 2.5 mg daily.  Normal bone density. She will be due for repeat examination in January 2025.  Muscle pain and cramps of the right thigh for the past month. Dr. Rica Records will be seeing her soon.   Plan: She was placed on anastrozole back in October but was unable to tolerate this due to severe arthralgias and muscle cramps. We discussed alternatives today including letrozole or tamoxifen. She wishes to avoid tamoxifen in view of potential thrombosis. Therefore, we will try her on letrozole 2.5 mg daily. If she is unable to tolerate this, she has been advised to call, and we will likely not pursue further endocrine therapy.  At least her risk is low for recurrence with a T1b, grade 1 tumor. Otherwise, we will see her back in 3 months for follow up exam. She knows she can call for any concerns regarding her breast cancer.  I provided 20 minutes of face-to-face time during this this encounter and > 50% was spent counseling as documented under my assessment and plan.    Derwood Kaplan, MD [facility] [address] [place] [address] Dept: [phone] Dept Fax: [phone]   I, Rita Ohara, am acting as scribe  for Derwood Kaplan, MD  I have reviewed this report as typed by the medical  scribe, and it is complete and accurate.  Hermina Barters

## 2021-09-15 ENCOUNTER — Encounter: Payer: Self-pay | Admitting: Oncology

## 2021-09-16 ENCOUNTER — Other Ambulatory Visit: Payer: Self-pay

## 2021-09-16 ENCOUNTER — Encounter: Payer: Self-pay | Admitting: Oncology

## 2021-09-16 ENCOUNTER — Inpatient Hospital Stay: Payer: Medicare Other | Attending: Oncology | Admitting: Oncology

## 2021-09-16 ENCOUNTER — Other Ambulatory Visit: Payer: Self-pay | Admitting: Oncology

## 2021-09-16 ENCOUNTER — Telehealth: Payer: Self-pay | Admitting: Oncology

## 2021-09-16 VITALS — BP 146/67 | HR 55 | Temp 97.6°F | Resp 20 | Ht 68.0 in | Wt 249.9 lb

## 2021-09-16 DIAGNOSIS — C50412 Malignant neoplasm of upper-outer quadrant of left female breast: Secondary | ICD-10-CM

## 2021-09-16 DIAGNOSIS — Z170 Estrogen receptor positive status [ER+]: Secondary | ICD-10-CM | POA: Diagnosis not present

## 2021-09-16 MED ORDER — LETROZOLE 2.5 MG PO TABS: 2.5000 mg | ORAL_TABLET | Freq: Every day | ORAL | 5 refills | Status: DC

## 2021-09-16 NOTE — Telephone Encounter (Signed)
Per 1/17 los next appt scheduled and confirmed with patient °

## 2021-11-04 DIAGNOSIS — N3281 Overactive bladder: Secondary | ICD-10-CM | POA: Diagnosis not present

## 2021-11-04 DIAGNOSIS — M1711 Unilateral primary osteoarthritis, right knee: Secondary | ICD-10-CM | POA: Diagnosis not present

## 2021-11-04 DIAGNOSIS — E559 Vitamin D deficiency, unspecified: Secondary | ICD-10-CM | POA: Diagnosis not present

## 2021-11-04 DIAGNOSIS — I10 Essential (primary) hypertension: Secondary | ICD-10-CM | POA: Diagnosis not present

## 2021-11-04 DIAGNOSIS — R600 Localized edema: Secondary | ICD-10-CM | POA: Diagnosis not present

## 2021-11-04 DIAGNOSIS — R7303 Prediabetes: Secondary | ICD-10-CM | POA: Diagnosis not present

## 2021-11-04 DIAGNOSIS — Z79899 Other long term (current) drug therapy: Secondary | ICD-10-CM | POA: Diagnosis not present

## 2021-11-04 DIAGNOSIS — M47812 Spondylosis without myelopathy or radiculopathy, cervical region: Secondary | ICD-10-CM | POA: Diagnosis not present

## 2021-11-04 DIAGNOSIS — J309 Allergic rhinitis, unspecified: Secondary | ICD-10-CM | POA: Diagnosis not present

## 2021-11-04 DIAGNOSIS — E785 Hyperlipidemia, unspecified: Secondary | ICD-10-CM | POA: Diagnosis not present

## 2021-11-04 DIAGNOSIS — K219 Gastro-esophageal reflux disease without esophagitis: Secondary | ICD-10-CM | POA: Diagnosis not present

## 2021-12-14 NOTE — Progress Notes (Signed)
?[name]  ?[address] ?[place],  [place]  [address] ?[phone] ? ?Clinic Day: 12/15/21 ? ?Referring physician: Nicholos Johns, MD ? ?CHIEF COMPLAINT:  ?CC: Stage IA left breast cancer ? ?Current Treatment: Hormonal therapy ? ? ?HISTORY OF PRESENT ILLNESS:  ?Meghan Carpenter is a 76 y.o. 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 This was found on screening bilateral mammogram from July 2022, which revealed a possible distortion in the left breast. Unilateral diagnostic left mammogram and ultrasound was pursued and confirmed a suspicious mass with distortion in the upper-outer left breast without a definite sonographic correlate. There was no evidence of left axillary lymphadenopathy. 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 HER2 was negative (1+). Estrogen and progesterone receptors were both positive at 95%. Ki67 was 5%. She was referred to Dr. Lilia Pro and underwent left breast lumpectomy and left axillary sentinel lymph node biopsy on September 15th. 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 All margins negative for invasive carcinoma and DCIS. One regional lymph node was negative for malignancy (0/1). She was placed on endocrine therapy with anastrozole in October 2022. ? ?She had menarche at age 56 and menopause at age 33.  She was on hormone replacement therapy for 3-4 years. She had a hysterectomy and bilateral salping oophorectomy in 2000 for fibroids. She has had 2 live births, the first at age 42. The radiation oncologist did not feel radiotherapy was necessary. She is up to date on colonoscopy. ? ?INTERVAL HISTORY:  ?Meghan Carpenter is here for routine follow up and states that she is doing well. She was placed on  anastrozole daily in October, but was not able to tolerate this due to severe muscle cramps and arthralgias.  We also tried her on letrozole but she was unable to tolerate this either and did take it for approximately 1 month.  She continues to complain of pain of the left knee especially and has trouble with insomnia.  She is otherwise doing well bone density from January 6th revealed a completely normal exam. Her  appetite is good, and her weight is down 2 pounds since her last visit.  She denies fever, chills or other signs of infection.  She denies nausea, vomiting, bowel issues, or abdominal pain.  She denies sore throat, cough, dyspnea, or chest pain. ? ?REVIEW OF SYSTEMS:  ?Review of Systems  ?Constitutional: Negative.  Negative for appetite change, chills, fatigue, fever and unexpected weight change.  ?HENT:  Negative.    ?Eyes: Negative.   ?Respiratory: Negative.  Negative for chest tightness, cough, hemoptysis, shortness of breath and wheezing.   ?Cardiovascular: Negative.  Negative for chest pain, leg swelling and palpitations.  ?Gastrointestinal: Negative.  Negative for abdominal distention, abdominal pain, blood in stool, constipation, diarrhea, nausea and vomiting.  ?Endocrine: Negative.   ?Genitourinary: Negative.  Negative for difficulty urinating, dysuria, frequency and hematuria.   ?Musculoskeletal:  Positive for arthralgias (of the bilateral knees, intermittent). Negative for back pain, flank pain, gait problem and myalgias.  ?Skin: Negative.   ?Neurological: Negative.  Negative for dizziness, extremity weakness, gait problem, headaches, light-headedness, numbness, seizures and speech difficulty.  ?Hematological: Negative.   ?Psychiatric/Behavioral: Negative.  Negative for depression and sleep disturbance. The patient is not nervous/anxious.    ? ?  VITALS:  ?Blood pressure 132/74, pulse 65, temperature 97.8 ?F (36.6 ?C), temperature source Oral, resp. rate (!) 24, height $RemoveBe'5\' 8"'TJGZWHqJa$  (1.727 m), weight 246  lb 12.8 oz (111.9 kg), SpO2 95 %.  ?Wt Readings from Last 3 Encounters:  ?12/15/21 246 lb 12.8 oz (111.9 kg)  ?09/16/21 249 lb 14.4 oz (113.4 kg)  ?06/16/21 250 lb 4.8 oz (113.5 kg)  ?  ?Body mass index is 37.53 kg/m?. ? ?Performance status (ECOG): 1 - Symptomatic but completely ambulatory ? ?PHYSICAL EXAM:  ?Physical Exam ?Constitutional:   ?   General: She is not in acute distress. ?   Appearance: Normal appearance. She is normal weight.  ?HENT:  ?   Head: Normocephalic and atraumatic.  ?Eyes:  ?   General: No scleral icterus. ?   Extraocular Movements: Extraocular movements intact.  ?   Conjunctiva/sclera: Conjunctivae normal.  ?   Pupils: Pupils are equal, round, and reactive to light.  ?Cardiovascular:  ?   Rate and Rhythm: Normal rate and regular rhythm.  ?   Heart sounds: Murmur heard.  ?  No friction rub. No gallop.  ?   Comments: 1/6 systolic murmur ?Pulmonary:  ?   Effort: Pulmonary effort is normal. No respiratory distress.  ?   Breath sounds: Normal breath sounds.  ?Chest:  ?   Comments: Well healed scar in the upper outer quadrant of the left breast and left axilla. No masses in either breast. ?Abdominal:  ?   General: Bowel sounds are normal. There is no distension.  ?   Palpations: Abdomen is soft. There is no hepatomegaly, splenomegaly or mass.  ?   Tenderness: There is no abdominal tenderness.  ?Musculoskeletal:     ?   General: Normal range of motion.  ?   Cervical back: Normal range of motion and neck supple.  ?   Right lower leg: Right lower leg edema: mild.  ?   Left lower leg: No edema (mild).  ?   Comments: Crepitation of the right knee  ?Lymphadenopathy:  ?   Cervical: No cervical adenopathy.  ?Skin: ?   General: Skin is warm and dry.  ?Neurological:  ?   General: No focal deficit present.  ?   Mental Status: She is alert and oriented to person, place, and time. Mental status is at baseline.  ?Psychiatric:     ?   Mood and Affect: Mood normal.     ?   Behavior: Behavior normal.     ?    Thought Content: Thought content normal.     ?   Judgment: Judgment normal.  ? ?LABS:  ? ? ?  Latest Ref Rng & Units 06/16/2021  ? 12:00 AM 02/20/2011  ? 12:38 PM  ?CBC  ?WBC  11.7        ?Hemoglobin 12.0 - 16.0 12.7      14.6    ?Hematocrit 36 - 46 38        ?Platelets 150 - 399 258        ?  ? This result is from an external source.  ? ? ?  Latest Ref Rng & Units 06/16/2021  ? 12:00 AM  ?CMP  ?BUN 4 - 21 14       ?Creatinine 0.5 - 1.1 0.8       ?Sodium 137 - 147 139       ?Potassium 3.4 - 5.3 4.1       ?Chloride 99 - 108 104       ?  CO2 13 - 22 30       ?Calcium 8.7 - 10.7 9.3       ?Alkaline Phos 25 - 125 110       ?AST 13 - 35 33       ?ALT 7 - 35 23       ?  ? This result is from an external source.  ? ? ?STUDIES:  ?No results found.  ? ? ?HISTORY:  ? ?Allergies:  ?Allergies  ?Allergen Reactions  ? Sulfa Antibiotics Nausea Only  ? ? ?Current Medications: ?Current Outpatient Medications  ?Medication Sig Dispense Refill  ? baclofen (LIORESAL) 10 MG tablet Take 10 mg by mouth 2 (two) times daily.    ? cetirizine (ZYRTEC) 10 MG tablet Take 10 mg by mouth daily.    ? cloNIDine (CATAPRES) 0.1 MG tablet 0.$RemoveBef'1mg'lhHFWVImav$  1/2 tablet daily    ? Coenzyme Q10 100 MG TABS Take 100 mg by mouth 2 (two) times daily.    ? diclofenac Sodium (VOLTAREN) 1 % GEL Apply topically as needed.    ? ezetimibe (ZETIA) 10 MG tablet Take 10 mg by mouth daily.    ? furosemide (LASIX) 20 MG tablet Take 20 mg by mouth daily as needed.    ? meloxicam (MOBIC) 7.5 MG tablet Take 7.5 mg by mouth 2 (two) times daily.    ? metoprolol tartrate (LOPRESSOR) 50 MG tablet Take 50 mg by mouth daily.    ? montelukast (SINGULAIR) 10 MG tablet Take 1 tablet by mouth daily.    ? omeprazole (PRILOSEC) 20 MG capsule Take 20 mg by mouth daily.    ? OVER THE COUNTER MEDICATION Hempvana cream as needed for arthritis\    ? Potassium 99 MG TABS Take by mouth as needed.    ? valsartan-hydrochlorothiazide (DIOVAN-HCT) 80-12.5 MG tablet Take by mouth.    ? Vitamin D,  Ergocalciferol, (DRISDOL) 1.25 MG (50000 UNIT) CAPS capsule Take 50,000 Units by mouth once a week.    ? ?Current Facility-Administered Medications  ?Medication Dose Route Frequency Provider Last Rate Last Admin  ? 0.9 %  so

## 2021-12-15 ENCOUNTER — Inpatient Hospital Stay: Payer: Medicare Other | Attending: Oncology | Admitting: Oncology

## 2021-12-15 ENCOUNTER — Telehealth: Payer: Self-pay | Admitting: Oncology

## 2021-12-15 VITALS — BP 132/74 | HR 65 | Temp 97.8°F | Resp 24 | Ht 68.0 in | Wt 246.8 lb

## 2021-12-15 DIAGNOSIS — Z170 Estrogen receptor positive status [ER+]: Secondary | ICD-10-CM | POA: Diagnosis not present

## 2021-12-15 DIAGNOSIS — C50412 Malignant neoplasm of upper-outer quadrant of left female breast: Secondary | ICD-10-CM | POA: Diagnosis not present

## 2021-12-15 NOTE — Telephone Encounter (Signed)
Per 12/15/21 los spoke with patient and confirmed next appt ?

## 2021-12-16 DIAGNOSIS — Z170 Estrogen receptor positive status [ER+]: Secondary | ICD-10-CM | POA: Diagnosis not present

## 2021-12-16 DIAGNOSIS — C50412 Malignant neoplasm of upper-outer quadrant of left female breast: Secondary | ICD-10-CM | POA: Diagnosis not present

## 2021-12-26 ENCOUNTER — Encounter: Payer: Self-pay | Admitting: Oncology

## 2022-01-18 DIAGNOSIS — M25531 Pain in right wrist: Secondary | ICD-10-CM | POA: Diagnosis not present

## 2022-02-10 DIAGNOSIS — J019 Acute sinusitis, unspecified: Secondary | ICD-10-CM | POA: Diagnosis not present

## 2022-02-10 DIAGNOSIS — I10 Essential (primary) hypertension: Secondary | ICD-10-CM | POA: Diagnosis not present

## 2022-02-10 DIAGNOSIS — E785 Hyperlipidemia, unspecified: Secondary | ICD-10-CM | POA: Diagnosis not present

## 2022-02-10 DIAGNOSIS — E559 Vitamin D deficiency, unspecified: Secondary | ICD-10-CM | POA: Diagnosis not present

## 2022-02-10 DIAGNOSIS — R7303 Prediabetes: Secondary | ICD-10-CM | POA: Diagnosis not present

## 2022-02-10 DIAGNOSIS — N3281 Overactive bladder: Secondary | ICD-10-CM | POA: Diagnosis not present

## 2022-02-10 DIAGNOSIS — M47812 Spondylosis without myelopathy or radiculopathy, cervical region: Secondary | ICD-10-CM | POA: Diagnosis not present

## 2022-02-10 DIAGNOSIS — J309 Allergic rhinitis, unspecified: Secondary | ICD-10-CM | POA: Diagnosis not present

## 2022-02-11 DIAGNOSIS — M7989 Other specified soft tissue disorders: Secondary | ICD-10-CM | POA: Diagnosis not present

## 2022-02-11 DIAGNOSIS — M19041 Primary osteoarthritis, right hand: Secondary | ICD-10-CM | POA: Diagnosis not present

## 2022-02-11 DIAGNOSIS — J019 Acute sinusitis, unspecified: Secondary | ICD-10-CM | POA: Diagnosis not present

## 2022-03-08 NOTE — Progress Notes (Signed)
[facility]  [address] [place],  [place]  [address] [phone]  Clinic Day: 03/09/22  Referring physician: Nicholos Johns, MD  CHIEF COMPLAINT:  CC: Stage IA left breast cancer  Current Treatment: Hormonal therapy   HISTORY OF PRESENT ILLNESS:  Meghan Carpenter is a 76 y.o. 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 This was found on screening bilateral mammogram from July 2022, which revealed a possible distortion in the left breast. Unilateral diagnostic left mammogram and ultrasound was pursued and confirmed a suspicious mass with distortion in the upper-outer left breast without a definite sonographic correlate. There was no evidence of left axillary lymphadenopathy. 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 HER2 was negative (1+). Estrogen and progesterone receptors were both positive at 95%. Ki67 was 5%. She was referred to Dr. Lilia Pro and underwent left breast lumpectomy and left axillary sentinel lymph node biopsy on September 15th. 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 All margins negative for invasive carcinoma and DCIS. One regional lymph node was negative for malignancy (0/1). She was placed on endocrine therapy with anastrozole in October 2022.  She had menarche at age 11 and menopause at age 38.  She was on hormone replacement therapy for 3-4 years. She had a hysterectomy and bilateral salping oophorectomy in 2000 for fibroids. She has had 2 live births, the first at age 63. The radiation oncologist did not feel radiotherapy was necessary. She is up to date on colonoscopy.  INTERVAL HISTORY:  Meghan Carpenter is here for routine follow up and states that she is doing well. She was placed on  anastrozole daily in October, but was not able to tolerate this due to severe muscle cramps and arthralgias.  We also tried her on letrozole but she was unable to tolerate this either and did take it for approximately 1 month. She is doing well and bone density from January 6th revealed a completely normal exam. Her  appetite is good, and her weight is up 2 pounds since her last visit.  She denies fever, chills or other signs of infection.  She denies nausea, vomiting, bowel issues, or abdominal pain. She denies sore throat, cough, dyspnea, or chest pain. She will be due for annual mammogram in August.  REVIEW OF SYSTEMS:  Review of Systems  Constitutional: Negative.  Negative for appetite change, chills, fatigue, fever and unexpected weight change.  HENT:  Negative.    Eyes: Negative.   Respiratory: Negative.  Negative for chest tightness, cough, hemoptysis, shortness of breath and wheezing.   Cardiovascular: Negative.  Negative for chest pain, leg swelling and palpitations.  Gastrointestinal: Negative.  Negative for abdominal distention, abdominal pain, blood in stool, constipation, diarrhea, nausea and vomiting.  Endocrine: Negative.   Genitourinary: Negative.  Negative for difficulty urinating, dysuria, frequency and hematuria.   Musculoskeletal:  Positive for arthralgias (of the bilateral knees, intermittent). Negative for back pain, flank pain, gait problem and myalgias.  Skin: Negative.   Neurological: Negative.  Negative for dizziness, extremity weakness, gait problem, headaches, light-headedness, numbness, seizures and speech difficulty.  Hematological: Negative.   Psychiatric/Behavioral: Negative.  Negative for depression and sleep disturbance. The patient is not nervous/anxious.      VITALS:  Blood pressure (!) 158/62, pulse (!) 56,  temperature 97.7 F (36.5 C), temperature source Oral, resp. rate 18, height $RemoveBe'5\' 8"'ndmkiQzbx$  (1.727 m), weight 248 lb 6.4 oz (112.7 kg), SpO2 97 %.  Wt Readings  from Last 3 Encounters:  03/09/22 248 lb 6.4 oz (112.7 kg)  12/15/21 246 lb 12.8 oz (111.9 kg)  09/16/21 249 lb 14.4 oz (113.4 kg)    Body mass index is 37.77 kg/m.  Performance status (ECOG): 1 - Symptomatic but completely ambulatory  PHYSICAL EXAM:  Physical Exam Constitutional:      General: She is not in acute distress.    Appearance: Normal appearance. She is normal weight.  HENT:     Head: Normocephalic and atraumatic.  Eyes:     General: No scleral icterus.    Extraocular Movements: Extraocular movements intact.     Conjunctiva/sclera: Conjunctivae normal.     Pupils: Pupils are equal, round, and reactive to light.  Cardiovascular:     Rate and Rhythm: Normal rate and regular rhythm.     Heart sounds: Murmur heard.     No friction rub. No gallop.     Comments: 1/6 systolic murmur Pulmonary:     Effort: Pulmonary effort is normal. No respiratory distress.     Breath sounds: Normal breath sounds.  Chest:     Comments: Well healed scar in the upper outer quadrant of the left breast and left axilla. No masses in either breast. Abdominal:     General: Bowel sounds are normal. There is no distension.     Palpations: Abdomen is soft. There is no hepatomegaly, splenomegaly or mass.     Tenderness: There is no abdominal tenderness.  Musculoskeletal:        General: Normal range of motion.     Cervical back: Normal range of motion and neck supple.     Right lower leg: Right lower leg edema: mild.     Left lower leg: No edema (mild).     Comments: Crepitation of the right knee  Lymphadenopathy:     Cervical: No cervical adenopathy.  Skin:    General: Skin is warm and dry.  Neurological:     General: No focal deficit present.     Mental Status: She is alert and oriented to person, place, and time. Mental status is at baseline.  Psychiatric:        Mood and Affect: Mood normal.        Behavior: Behavior normal.        Thought Content: Thought content normal.         Judgment: Judgment normal.    LABS:      Latest Ref Rng & Units 06/16/2021   12:00 AM 02/20/2011   12:38 PM  CBC  WBC  11.7       Hemoglobin 12.0 - 16.0 12.7     14.6   Hematocrit 36 - 46 38       Platelets 150 - 399 258          This result is from an external source.      Latest Ref Rng & Units 06/16/2021   12:00 AM  CMP  BUN 4 - 21 14      Creatinine 0.5 - 1.1 0.8      Sodium 137 - 147 139      Potassium 3.4 - 5.3 4.1      Chloride 99 - 108 104      CO2 13 - 22 30      Calcium 8.7 -  10.7 9.3      Alkaline Phos 25 - 125 110      AST 13 - 35 33      ALT 7 - 35 23         This result is from an external source.    STUDIES:  No results found.    HISTORY:   Allergies:  Allergies  Allergen Reactions   Sulfa Antibiotics Nausea Only    Current Medications: Current Outpatient Medications  Medication Sig Dispense Refill   aspirin EC 81 MG tablet Take 81 mg by mouth 2 (two) times a week. Swallow whole.     baclofen (LIORESAL) 10 MG tablet Take 10 mg by mouth 2 (two) times daily.     cetirizine (ZYRTEC) 10 MG tablet Take 10 mg by mouth daily.     cloNIDine (CATAPRES) 0.1 MG tablet 0.1mg  1/2 tablet daily     Coenzyme Q10 100 MG TABS Take 100 mg by mouth 2 (two) times daily.     meloxicam (MOBIC) 7.5 MG tablet Take 7.5 mg by mouth 2 (two) times daily.     metoprolol tartrate (LOPRESSOR) 50 MG tablet Take 50 mg by mouth daily.     montelukast (SINGULAIR) 10 MG tablet Take 1 tablet by mouth daily.     Multiple Vitamins-Minerals (MULTIVITAMIN WITH MINERALS) tablet Take 1 tablet by mouth daily.     OVER THE COUNTER MEDICATION Hempvana cream as needed for arthritis\     VALSARTAN PO Take by mouth. Valsartan 12.5mg , 1/2 tablet daily     Vitamin D, Ergocalciferol, (DRISDOL) 1.25 MG (50000 UNIT) CAPS capsule Take 50,000 Units by mouth once a week.     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 She is healing well and will not need radiation or chemotherapy. She was unable to tolerate hormonal therapy.  Normal bone density. She will be due for repeat examination in January 2025   Plan: She was placed on anastrozole back in October but was unable to tolerate this due to severe arthralgias and muscle cramps. We discussed alternatives but she was unable to tolerate letrozole either. She wishes to avoid tamoxifen in view of potential thrombosis.  We will not try to pursue further hormonal therapy.  At least her risk is low for recurrence with a T1b, grade 1 tumor. Otherwise, we will see her back in 4 months for follow up exam. She knows she can call for any concerns regarding her breast cancer.  I provided 20 minutes of face-to-face time during this this encounter and > 50% was spent counseling as documented under my assessment and plan.    Derwood Kaplan, MD [facility] [address] [place] [address] Dept: [phone] Dept Fax: [phone]

## 2022-03-09 ENCOUNTER — Inpatient Hospital Stay: Payer: Medicare Other | Attending: Oncology | Admitting: Oncology

## 2022-03-09 ENCOUNTER — Telehealth: Payer: Self-pay | Admitting: Oncology

## 2022-03-09 ENCOUNTER — Encounter: Payer: Self-pay | Admitting: Oncology

## 2022-03-09 VITALS — BP 158/62 | HR 56 | Temp 97.7°F | Resp 18 | Ht 68.0 in | Wt 248.4 lb

## 2022-03-09 DIAGNOSIS — C50412 Malignant neoplasm of upper-outer quadrant of left female breast: Secondary | ICD-10-CM

## 2022-03-09 DIAGNOSIS — M1811 Unilateral primary osteoarthritis of first carpometacarpal joint, right hand: Secondary | ICD-10-CM | POA: Diagnosis not present

## 2022-03-09 DIAGNOSIS — Z170 Estrogen receptor positive status [ER+]: Secondary | ICD-10-CM

## 2022-03-09 NOTE — Telephone Encounter (Signed)
Per 03/09/22 los next appt scheduled and confirmed with patient 

## 2022-03-16 ENCOUNTER — Ambulatory Visit: Payer: Medicare Other | Admitting: Oncology

## 2022-04-01 DIAGNOSIS — Z853 Personal history of malignant neoplasm of breast: Secondary | ICD-10-CM | POA: Diagnosis not present

## 2022-04-01 DIAGNOSIS — C50512 Malignant neoplasm of lower-outer quadrant of left female breast: Secondary | ICD-10-CM | POA: Diagnosis not present

## 2022-04-01 DIAGNOSIS — R928 Other abnormal and inconclusive findings on diagnostic imaging of breast: Secondary | ICD-10-CM | POA: Diagnosis not present

## 2022-04-01 DIAGNOSIS — N6321 Unspecified lump in the left breast, upper outer quadrant: Secondary | ICD-10-CM | POA: Diagnosis not present

## 2022-04-03 DIAGNOSIS — M1811 Unilateral primary osteoarthritis of first carpometacarpal joint, right hand: Secondary | ICD-10-CM | POA: Diagnosis not present

## 2022-04-08 DIAGNOSIS — N6012 Diffuse cystic mastopathy of left breast: Secondary | ICD-10-CM | POA: Diagnosis not present

## 2022-04-08 DIAGNOSIS — N641 Fat necrosis of breast: Secondary | ICD-10-CM | POA: Diagnosis not present

## 2022-04-08 DIAGNOSIS — N6032 Fibrosclerosis of left breast: Secondary | ICD-10-CM | POA: Diagnosis not present

## 2022-04-08 DIAGNOSIS — N6321 Unspecified lump in the left breast, upper outer quadrant: Secondary | ICD-10-CM | POA: Diagnosis not present

## 2022-04-14 DIAGNOSIS — Z853 Personal history of malignant neoplasm of breast: Secondary | ICD-10-CM | POA: Diagnosis not present

## 2022-05-11 DIAGNOSIS — M1811 Unilateral primary osteoarthritis of first carpometacarpal joint, right hand: Secondary | ICD-10-CM

## 2022-05-11 DIAGNOSIS — M79644 Pain in right finger(s): Secondary | ICD-10-CM

## 2022-05-11 DIAGNOSIS — M13841 Other specified arthritis, right hand: Secondary | ICD-10-CM | POA: Diagnosis not present

## 2022-05-11 HISTORY — DX: Pain in right finger(s): M79.644

## 2022-05-11 HISTORY — DX: Unilateral primary osteoarthritis of first carpometacarpal joint, right hand: M18.11

## 2022-05-14 DIAGNOSIS — M545 Low back pain, unspecified: Secondary | ICD-10-CM | POA: Diagnosis not present

## 2022-05-14 DIAGNOSIS — M1711 Unilateral primary osteoarthritis, right knee: Secondary | ICD-10-CM | POA: Diagnosis not present

## 2022-05-14 DIAGNOSIS — E785 Hyperlipidemia, unspecified: Secondary | ICD-10-CM | POA: Diagnosis not present

## 2022-05-14 DIAGNOSIS — R7303 Prediabetes: Secondary | ICD-10-CM | POA: Diagnosis not present

## 2022-05-14 DIAGNOSIS — I10 Essential (primary) hypertension: Secondary | ICD-10-CM | POA: Diagnosis not present

## 2022-05-15 DIAGNOSIS — E559 Vitamin D deficiency, unspecified: Secondary | ICD-10-CM | POA: Diagnosis not present

## 2022-05-15 DIAGNOSIS — Z79899 Other long term (current) drug therapy: Secondary | ICD-10-CM | POA: Diagnosis not present

## 2022-06-18 IMAGING — MG MM BREAST BX W LOC DEV 1ST LESION IMAGE BX SPEC STEREO GUIDE*L*
8 of 11 series · 8 of 27 positions shown · non-contrast
Comparison: Previous exams.
COMPARISON: Previous exams.

Addendum:
CLINICAL DATA: Stereotactic biopsy a left breast mass with
distortion/spiculation

EXAM:
LEFT BREAST STEREOTACTIC CORE NEEDLE BIOPSY

[L (1 of 6)]
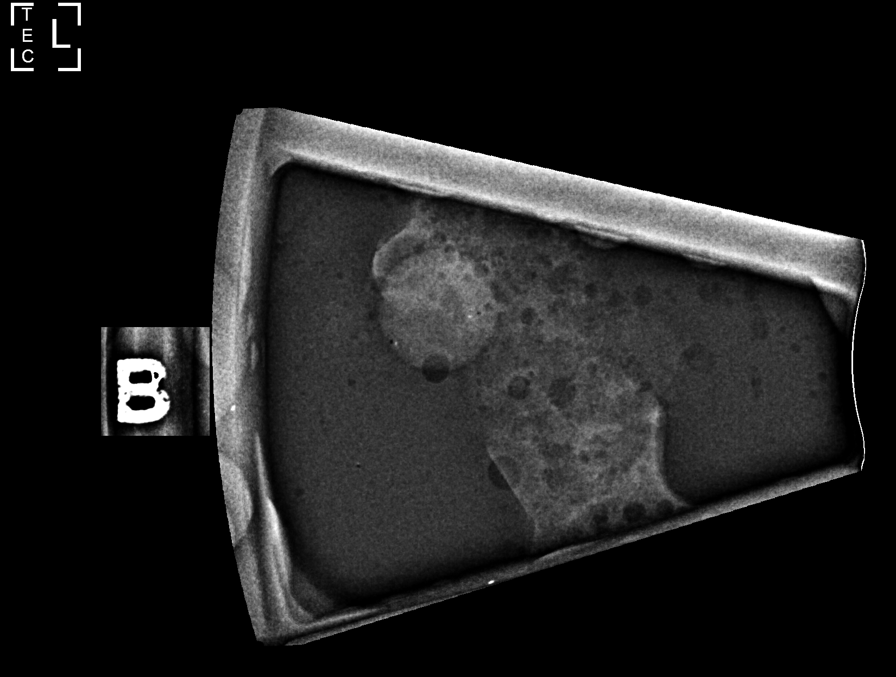

[L (2 of 6)]
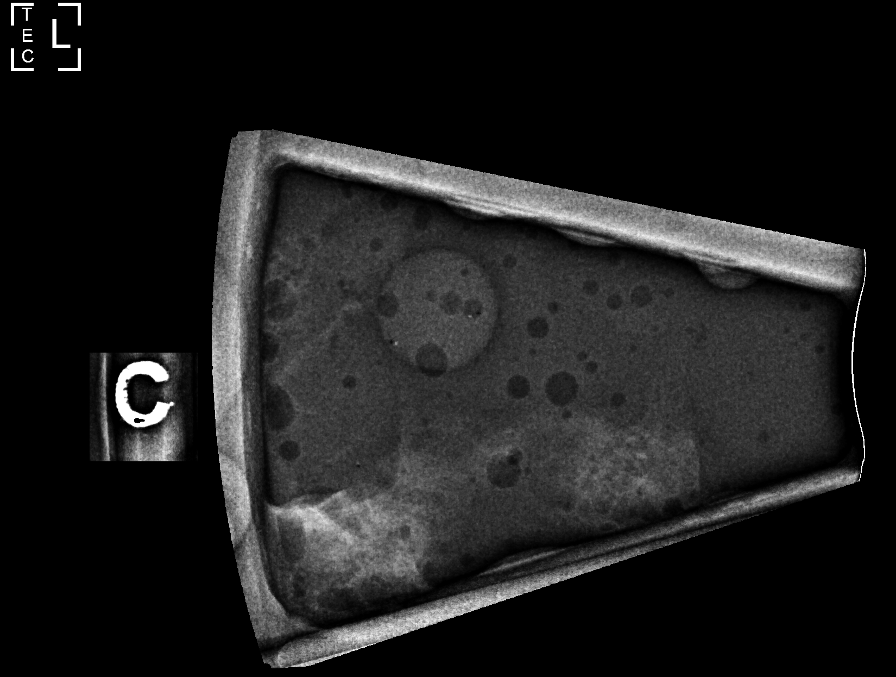

[L (3 of 6)]
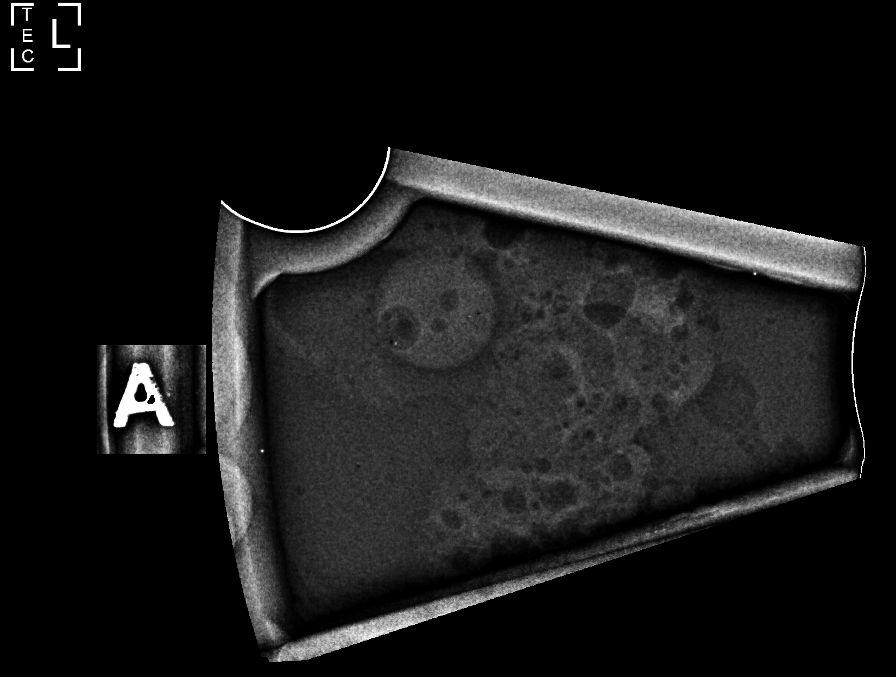

[L (4 of 6)]
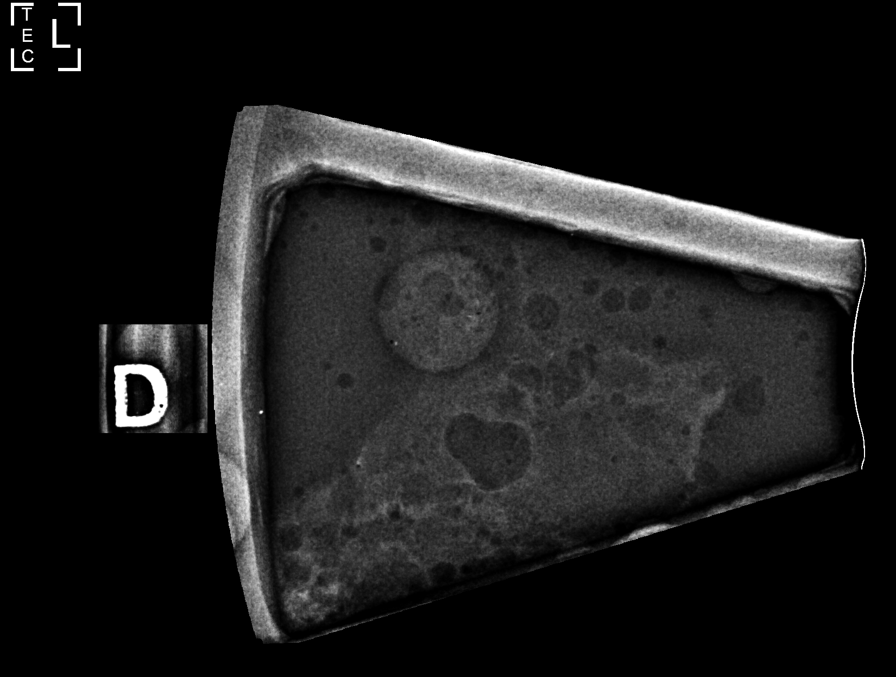

[L (5 of 6)]
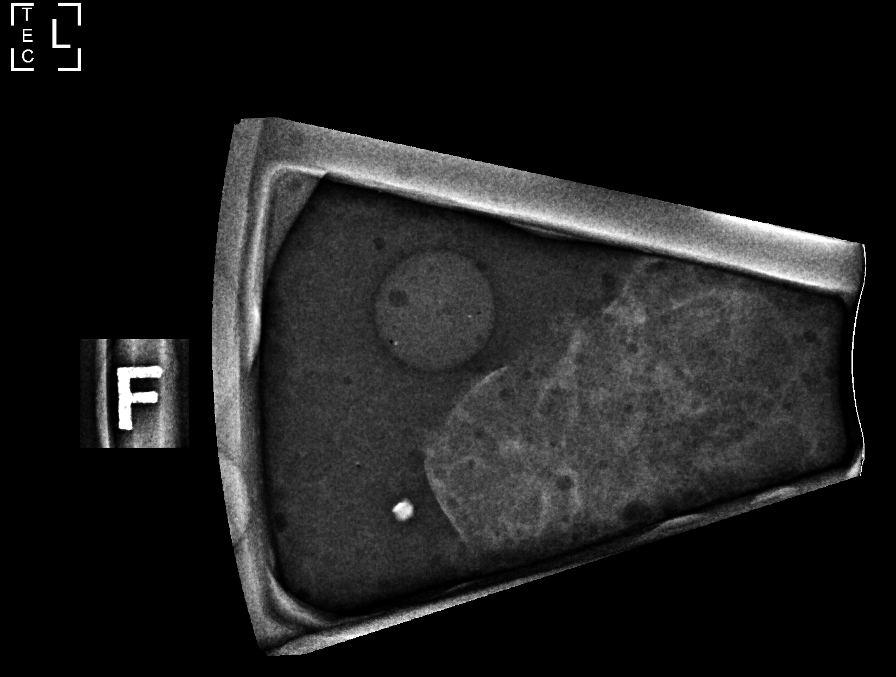

[L (6 of 6)]
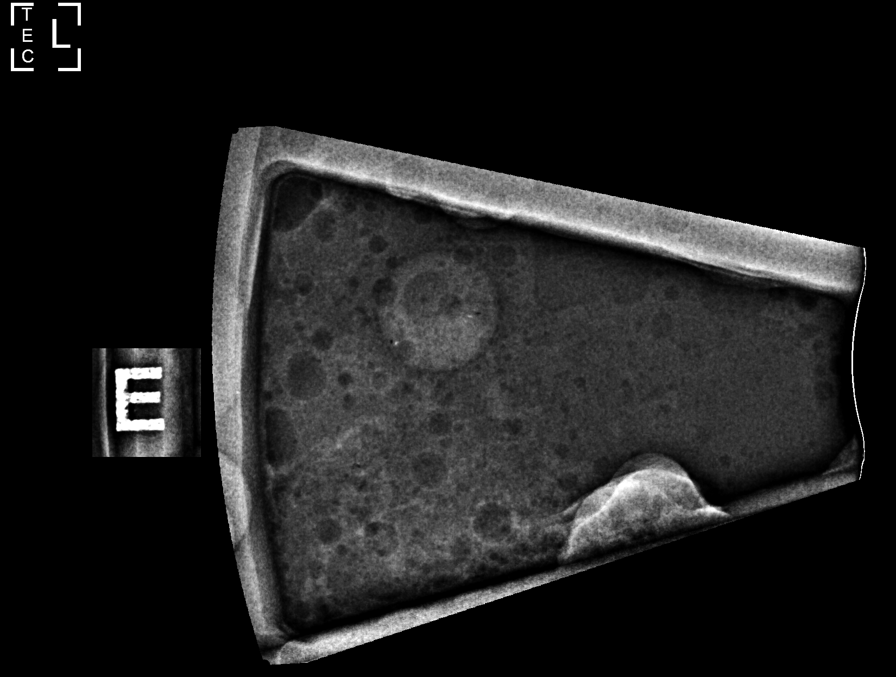

[L CC]
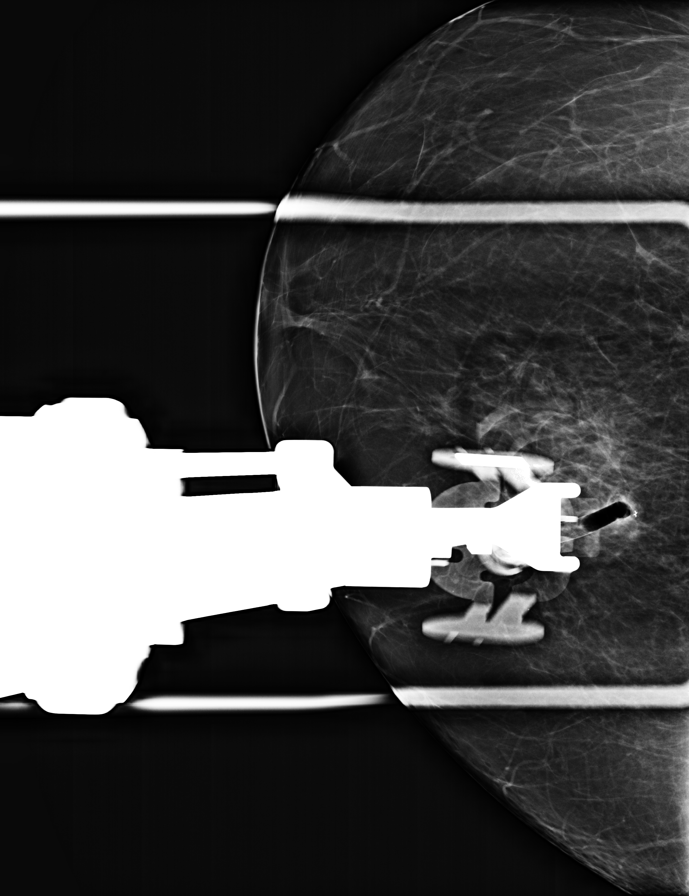

[L CC tomo · tomo slice 38/75.0]
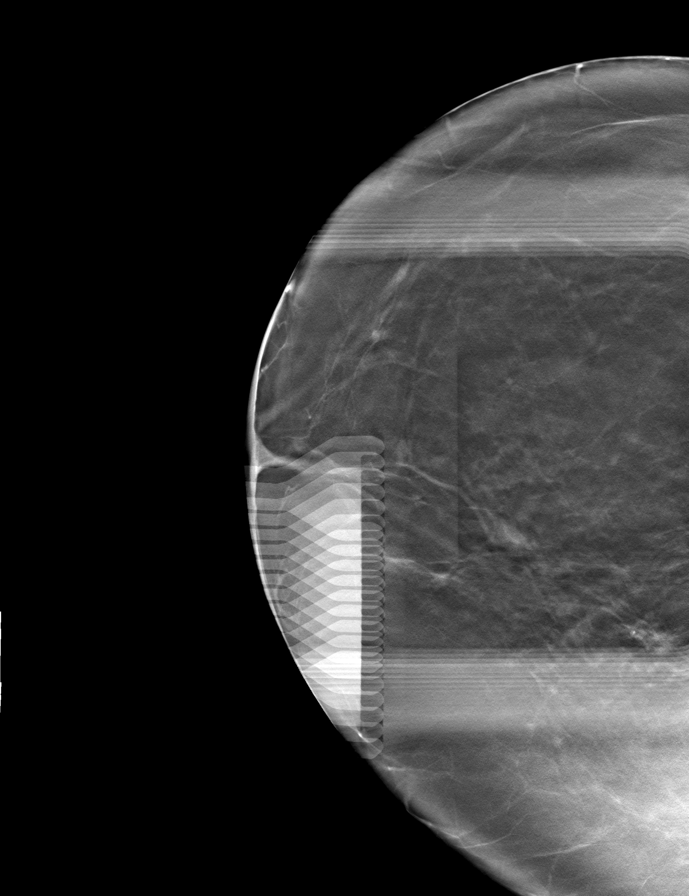

[8 of 27 positions shown; findings below may reference images not displayed]



Using sterile technique and 1% Lidocaine as local anesthetic, under
stereotactic guidance, a 9 gauge vacuum assisted device was used to
perform core needle biopsy of an upper outer quadrant left breast
mass using a superior approach.

Lesion quadrant: Upper outer left breast

At the conclusion of the procedure, an X shaped tissue marker clip
was deployed into the biopsy cavity. Follow-up 2-view mammogram was
performed and dictated separately.
IMPRESSION: Stereotactic-guided biopsy of the mass in the upper-outer left
breast. No apparent complications.

ADDENDUM:
Pathology revealed GRADE I INVASIVE DUCTAL CARCINOMA, DUCTAL
CARCINOMA IN SITU of the Left breast, upper outer, (x clip). This
was found to be concordant by Dr. Gullain James Jonasi.

Pathology results were discussed with the patient and her husband by
telephone. The patient reported doing well after the biopsy with
tenderness at the site. Post biopsy instructions and care were
reviewed and questions were answered. The patient was encouraged to
call The [REDACTED] for any additional
concerns. My direct phone number was provided.

Surgical consultation has been arranged with Dr. Mirihen Taka at
[REDACTED], [HOSPITAL] location on May 06, 2021.

Pathology report was faxed to Dr. Stine on May 01, 2021.

Pathology results reported by Adamir Kutz, RN on 05/01/2021.



Using sterile technique and 1% Lidocaine as local anesthetic, under
stereotactic guidance, a 9 gauge vacuum assisted device was used to
perform core needle biopsy of an upper outer quadrant left breast
mass using a superior approach.

Lesion quadrant: Upper outer left breast

At the conclusion of the procedure, an X shaped tissue marker clip
was deployed into the biopsy cavity. Follow-up 2-view mammogram was
performed and dictated separately.
IMPRESSION: Stereotactic-guided biopsy of the mass in the upper-outer left
breast. No apparent complications.

## 2022-06-18 IMAGING — MG MM BREAST LOCALIZATION CLIP
4 series · 4 of 12 positions shown · non-contrast
Comparison: Previous exam(s).

CLINICAL DATA: Evaluate biopsy marker after left breast mass biopsy

EXAM:
3D DIAGNOSTIC LEFT MAMMOGRAM POST STEREOTACTIC BIOPSY

[L ML synth-2D]
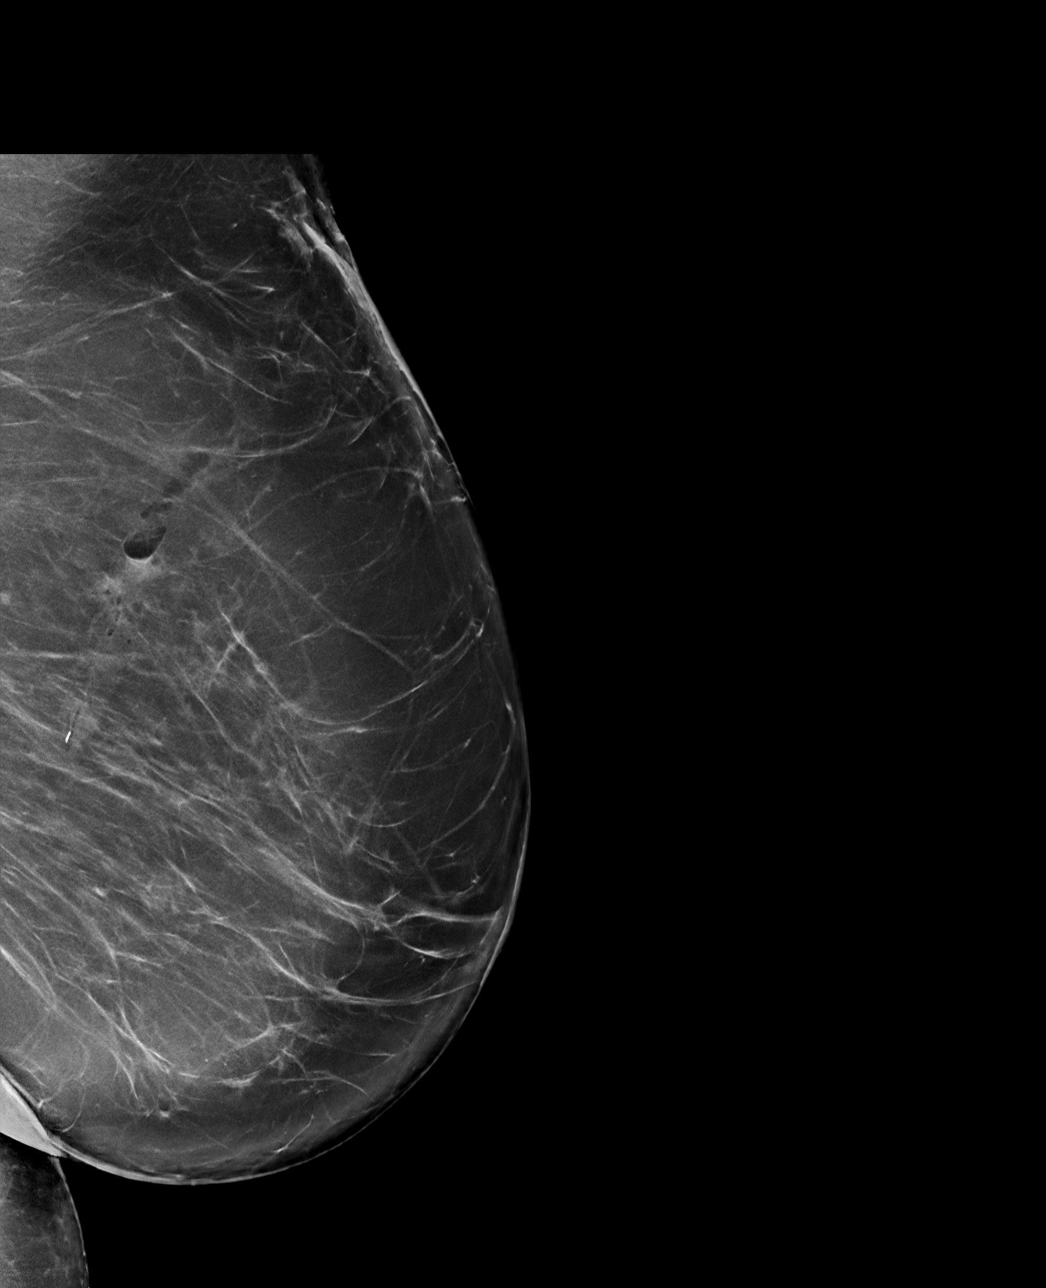

[L CC synth-2D]
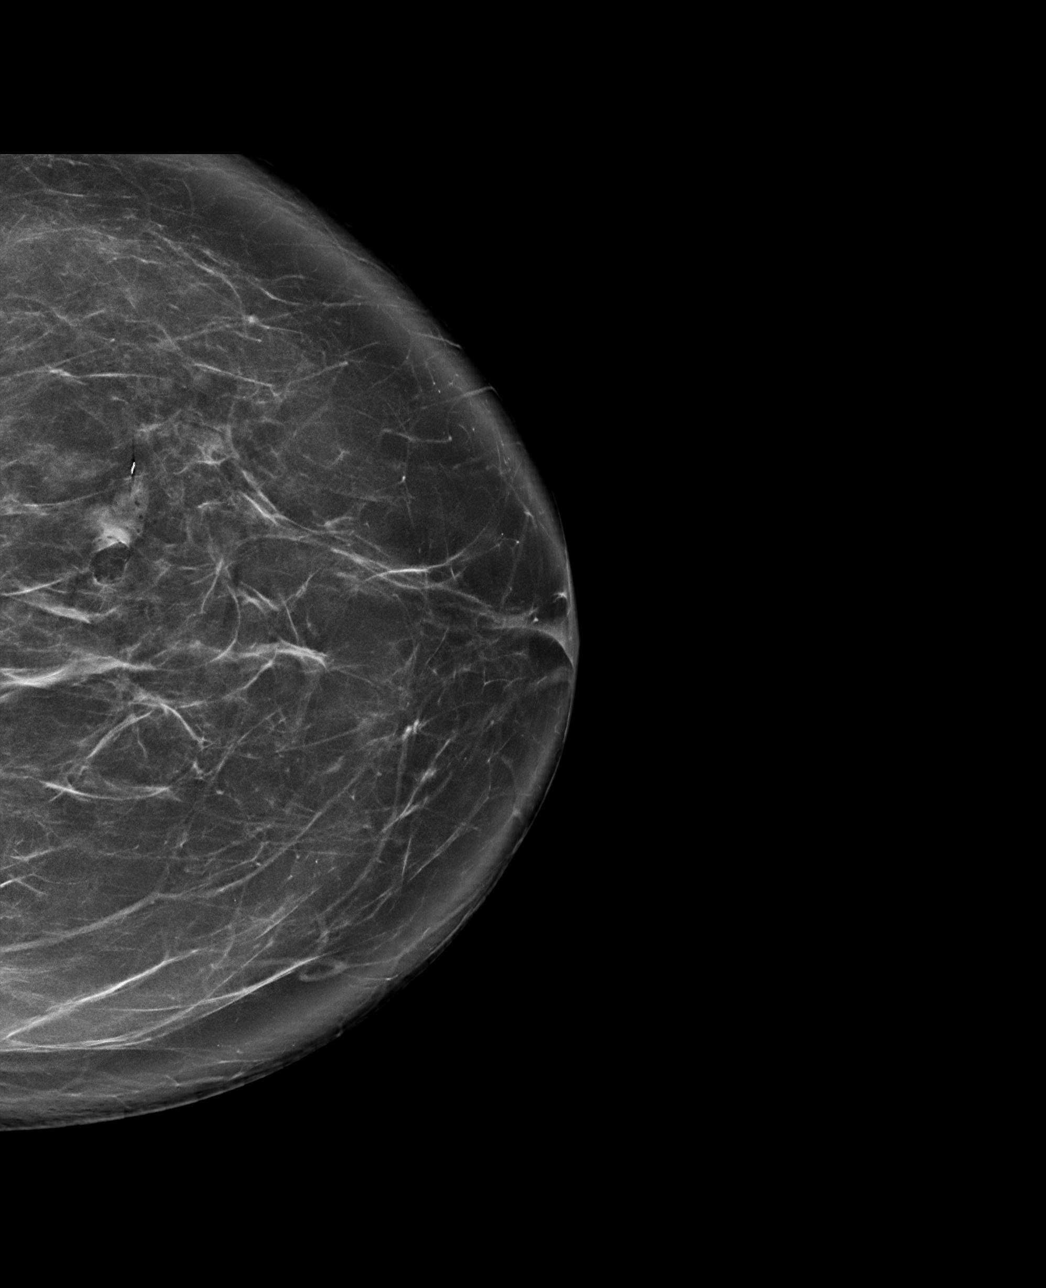

[L ML tomo · tomo slice 49/96.0]
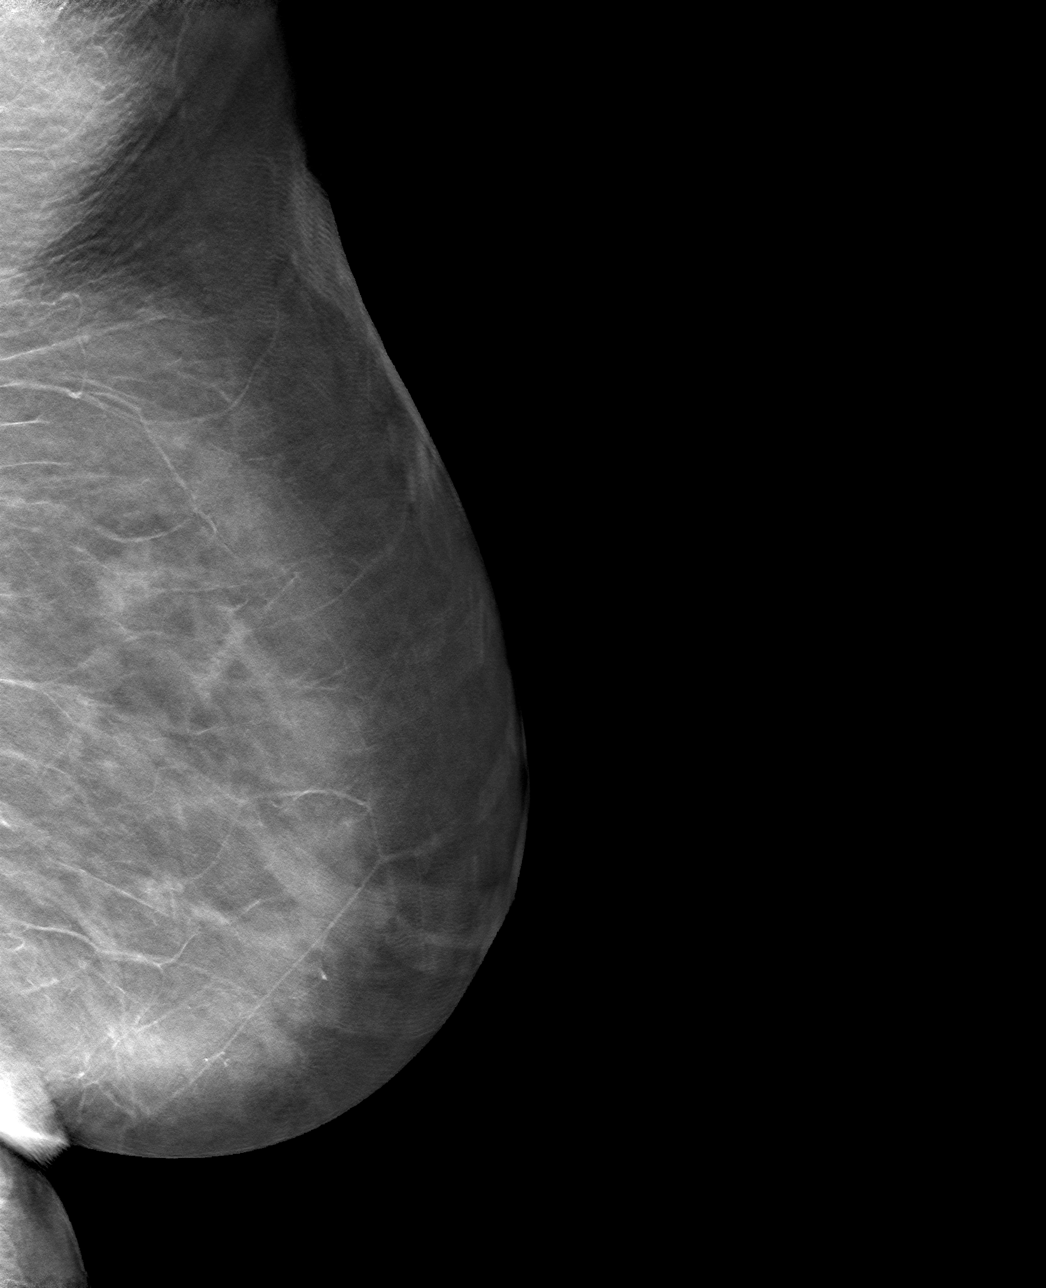

[L CC tomo · tomo slice 47/93.0]
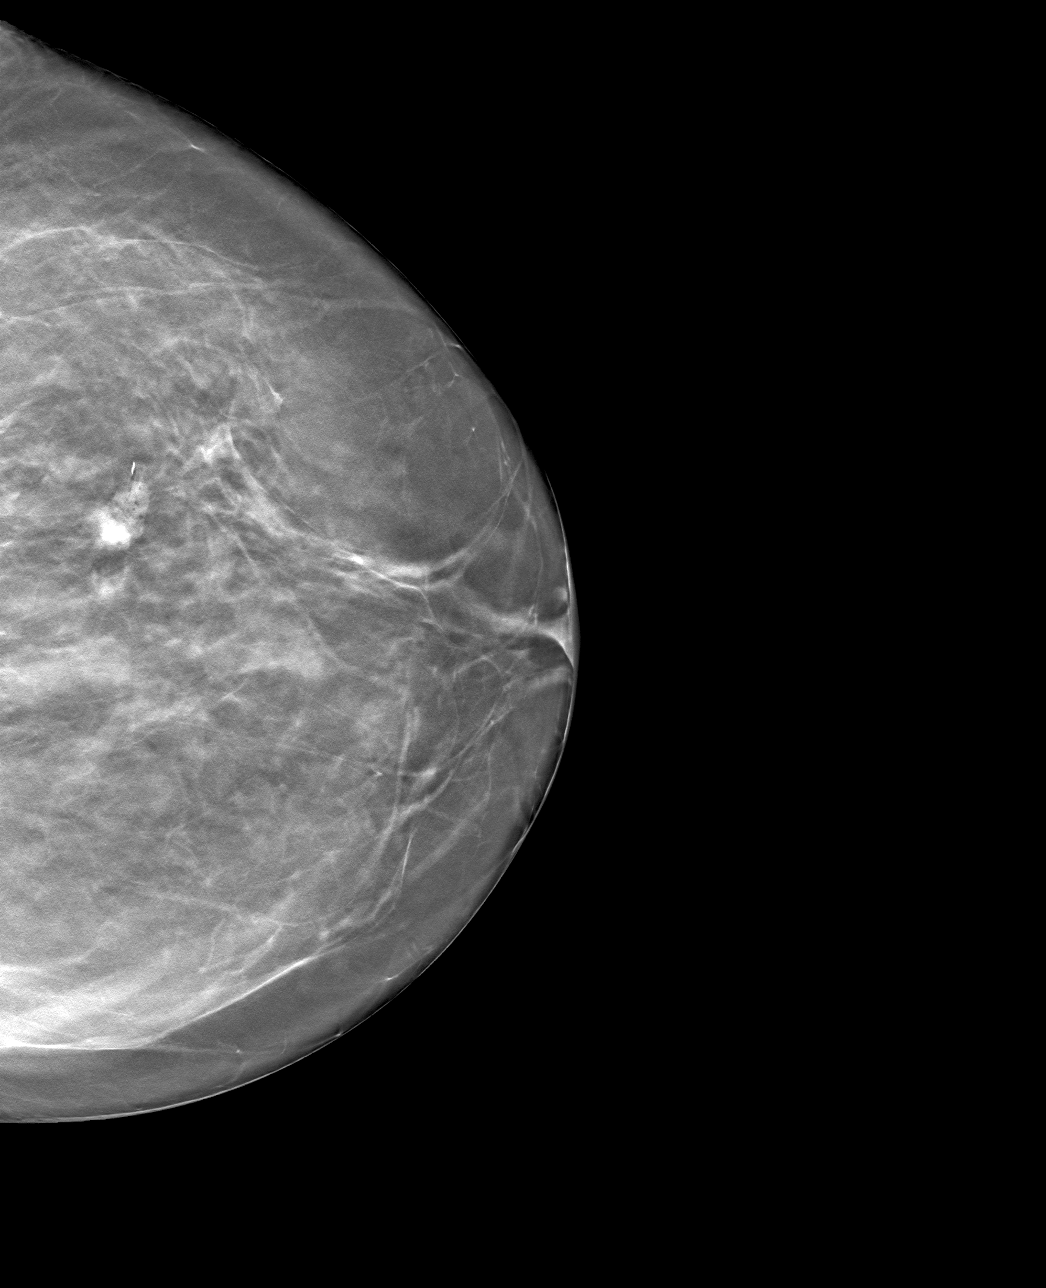

[4 of 12 positions shown; findings below may reference images not displayed]

FINDINGS: 3D Mammographic images were obtained following stereotactic guided
biopsy of a left breast mass. The biopsy clip migrated 3.8 cm
inferior to the biopsied left breast mass.
IMPRESSION: The X shaped biopsy marker migrated 3.8 cm inferior to the biopsied
left breast mass.

Final Assessment: Post Procedure Mammograms for Marker Placement

## 2022-06-30 DIAGNOSIS — M1811 Unilateral primary osteoarthritis of first carpometacarpal joint, right hand: Secondary | ICD-10-CM | POA: Diagnosis not present

## 2022-06-30 DIAGNOSIS — M13841 Other specified arthritis, right hand: Secondary | ICD-10-CM | POA: Diagnosis not present

## 2022-06-30 DIAGNOSIS — M13831 Other specified arthritis, right wrist: Secondary | ICD-10-CM | POA: Diagnosis not present

## 2022-06-30 HISTORY — PX: OTHER SURGICAL HISTORY: SHX169

## 2022-07-10 ENCOUNTER — Telehealth: Payer: Self-pay | Admitting: Oncology

## 2022-07-10 ENCOUNTER — Encounter: Payer: Self-pay | Admitting: Oncology

## 2022-07-10 ENCOUNTER — Inpatient Hospital Stay: Payer: Medicare Other | Attending: Oncology | Admitting: Oncology

## 2022-07-10 VITALS — BP 152/65 | HR 84 | Temp 97.8°F | Resp 20 | Ht 68.0 in | Wt 243.6 lb

## 2022-07-10 DIAGNOSIS — Z170 Estrogen receptor positive status [ER+]: Secondary | ICD-10-CM

## 2022-07-10 DIAGNOSIS — C50412 Malignant neoplasm of upper-outer quadrant of left female breast: Secondary | ICD-10-CM | POA: Diagnosis not present

## 2022-07-10 NOTE — Telephone Encounter (Signed)
07/10/22 next appt scheduled and confirmed with patient

## 2022-07-10 NOTE — Progress Notes (Signed)
[facility]  [address] [place],  [place]  [address] [phone]  Clinic Day: 07/10/22  Referring physician: Nicholos Johns, MD  CHIEF COMPLAINT:  CC: Stage IA left breast cancer  Current Treatment: Hormonal therapy  HISTORY OF PRESENT ILLNESS:  Meghan Carpenter is a 76 y.o. 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 This was found on screening bilateral mammogram from July 2022, which revealed a possible distortion in the left breast. Unilateral diagnostic left mammogram and ultrasound was pursued and confirmed a suspicious mass with distortion in the upper-outer left breast without a definite sonographic correlate. There was no evidence of left axillary lymphadenopathy. 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 HER2 was negative (1+). Estrogen and progesterone receptors were both positive at 95%. Ki67 was 5%. She was referred to Dr. Lilia Pro and underwent left breast lumpectomy and left axillary sentinel lymph node biopsy on September 15th. 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 All margins negative for invasive carcinoma and DCIS. One regional lymph node was negative for malignancy (0/1). She was placed on endocrine therapy with anastrozole in October 2022.  She had menarche at age 53 and menopause at age 89.  She was on hormone replacement therapy for 3-4 years. She had a hysterectomy and bilateral salping oophorectomy in 2000 for fibroids. She has had 2 live births, the first at age 34. The radiation oncologist did not feel radiotherapy was necessary. She is up to date on colonoscopy.  INTERVAL HISTORY:  Meghan Carpenter is here for routine follow up. She was placed on anastrozole daily in October, but was  not able to tolerate this due to severe muscle cramps and arthralgias. We also tried her on letrozole but she was unable to tolerate this either and did take it for approximately 1 month. She had a bone density scan 09/05/21 which revealed a completely normal exam. She had an annual mammogram August 2023 which showed a complex solid mass in the left breast measuring 1.5cm. She then had a biopsy on 04/08/22 which was benign. Today, she states that she is doing well overall without any other concerns from an oncologic perspective. She denies any nipple discharge as well as any new lumps or bumps. She had right hand CMC arthroplasty due to her arthritis and is currently wearing a cast. She has ortho surg follow up next week. Patient has lost 5lbs since July 2023. She denies fever, chills or other signs of infection.  She denies nausea, vomiting, bowel issues, or abdominal pain. She denies sore throat, cough, dyspnea, or chest pain.   REVIEW OF SYSTEMS:  Review of Systems  Constitutional:  Negative for appetite change, chills, fever and unexpected weight change.  HENT:  Negative.  Negative for lump/mass, mouth sores and sore throat.   Eyes: Negative.   Respiratory: Negative.  Negative for chest tightness, cough, hemoptysis, shortness of breath and wheezing.   Cardiovascular: Negative.  Negative for chest pain, leg swelling and palpitations.  Gastrointestinal: Negative.  Negative for abdominal distention, abdominal pain, blood in stool, constipation, diarrhea, nausea and vomiting.  Endocrine: Negative.   Genitourinary: Negative.  Negative for difficulty urinating, dysuria, frequency and hematuria.   Musculoskeletal:  Positive for arthralgias (right hand s/p surgery). Negative for back pain, flank pain  and gait problem.  Skin: Negative.   Neurological:  Negative for dizziness, extremity weakness, gait problem, headaches, light-headedness, numbness, seizures and speech difficulty.  Hematological: Negative.   Negative for adenopathy. Does not bruise/bleed easily.  Psychiatric/Behavioral: Negative.  Negative for depression and sleep disturbance. The patient is not nervous/anxious.      VITALS:  Blood pressure (!) 152/65, pulse 84, temperature 97.8 F (36.6 C), temperature source Oral, resp. rate 20, height 5' 8" (1.727 m), weight 243 lb 9.6 oz (110.5 kg), SpO2 93 %.  Wt Readings from Last 3 Encounters:  07/10/22 243 lb 9.6 oz (110.5 kg)  03/09/22 248 lb 6.4 oz (112.7 kg)  12/15/21 246 lb 12.8 oz (111.9 kg)    Body mass index is 37.04 kg/m.  Performance status (ECOG): 1 - Symptomatic but completely ambulatory  PHYSICAL EXAM:  Physical Exam Constitutional:      General: She is not in acute distress.    Appearance: Normal appearance. She is normal weight.  HENT:     Head: Normocephalic and atraumatic.  Eyes:     General: No scleral icterus.    Extraocular Movements: Extraocular movements intact.     Conjunctiva/sclera: Conjunctivae normal.     Pupils: Pupils are equal, round, and reactive to light.  Cardiovascular:     Rate and Rhythm: Normal rate and regular rhythm.     Heart sounds: No murmur heard.    No friction rub. No gallop.  Pulmonary:     Effort: Pulmonary effort is normal. No respiratory distress.     Breath sounds: Normal breath sounds.  Chest:     Comments: Well healed scar in the upper outer quadrant of the left breast. No masses in either breast. Abdominal:     General: Bowel sounds are normal. There is no distension.     Palpations: Abdomen is soft. There is no hepatomegaly, splenomegaly or mass.     Tenderness: There is no abdominal tenderness.  Musculoskeletal:        General: Normal range of motion.     Cervical back: Normal range of motion and neck supple.     Right lower leg: Right lower leg edema: mild.     Left lower leg: No edema (mild).  Lymphadenopathy:     Cervical: No cervical adenopathy.  Skin:    General: Skin is warm and dry.  Neurological:      General: No focal deficit present.     Mental Status: She is alert and oriented to person, place, and time. Mental status is at baseline.  Psychiatric:        Mood and Affect: Mood normal.        Behavior: Behavior normal.        Thought Content: Thought content normal.        Judgment: Judgment normal.    LABS:      Latest Ref Rng & Units 06/16/2021   12:00 AM 02/20/2011   12:38 PM  CBC  WBC  11.7       Hemoglobin 12.0 - 16.0 12.7     14.6   Hematocrit 36 - 46 38       Platelets 150 - 399 258          This result is from an external source.      Latest Ref Rng & Units 06/16/2021   12:00 AM  CMP  BUN 4 - 21 14      Creatinine 0.5 - 1.1 0.8  Sodium 137 - 147 139      Potassium 3.4 - 5.3 4.1      Chloride 99 - 108 104      CO2 13 - 22 30      Calcium 8.7 - 10.7 9.3      Alkaline Phos 25 - 125 110      AST 13 - 35 33      ALT 7 - 35 23         This result is from an external source.    STUDIES:  No results found.   Pathology report 04/08/2022 Atrophic breast tissue with patchy fibrosis, fat necrosis, cystic change and foreign body reaction. No atypia, in situ or invasive malignancy identified.  Mammogram 04/01/2022 Impression: Complex cystic and solid mass in the left breast at the 1 oclock axis, 6cm from the nipple, measuring 1.5cm, corresponding to a new mass seen on mammogram in the vicinity of the earlier lumpectomy site. This may represent fat necrosis or postsurgical seroma. US-guided biopsy is recommended to exclude malignancy. No evidence of malignancy within the right breast.   Bone density scan 09/05/2021      MM LT BREAST BX W LOC DEV 1ST LESION IMAGE BX SPEC STEREO GUIDE 04/30/2021 ADDENDUM REPORT: 05/01/2021 12:26   ADDENDUM: Pathology revealed GRADE I INVASIVE DUCTAL CARCINOMA, DUCTAL CARCINOMA IN SITU of the Left breast, upper outer, (x clip). This was found to be concordant by Dr. Dorise Bullion.   Pathology results were discussed with the  patient and her husband by telephone. The patient reported doing well after the biopsy with tenderness at the site. Post biopsy instructions and care were reviewed and questions were answered. The patient was encouraged to call [facility] for any additional concerns. My direct phone number was provided.   Surgical consultation has been arranged with Dr. Orrin Brigham at [facility] [place] location on May 06, 2021.   Pathology report was faxed to Dr. Lilia Pro on May 01, 2021.   Pathology results reported by Terie Purser, RN on 05/01/2021.    HISTORY:   Allergies:  Allergies  Allergen Reactions   Misc. Sulfonamide Containing Compounds Other (See Comments)   Sulfa Antibiotics Nausea Only    Current Medications: Current Outpatient Medications  Medication Sig Dispense Refill   methocarbamol (ROBAXIN) 500 MG tablet Take 1 tablet every 6-8 hours by oral route as needed for spasm for 10 days.     ZOFRAN 4 MG tablet Take 2 tablets every 8 hours by oral route.     aspirin EC 81 MG tablet Take 81 mg by mouth 2 (two) times a week. Swallow whole.     baclofen (LIORESAL) 10 MG tablet Take 10 mg by mouth 2 (two) times daily.     cetirizine (ZYRTEC) 10 MG tablet Take 10 mg by mouth daily.     cloNIDine (CATAPRES) 0.1 MG tablet 0.42m 1/2 tablet daily     Coenzyme Q10 100 MG TABS Take 100 mg by mouth 2 (two) times daily.     ezetimibe (ZETIA) 10 MG tablet Take 1 tablet by mouth at bedtime for elevated lipids.     furosemide (LASIX) 20 MG tablet      meloxicam (MOBIC) 7.5 MG tablet Take 7.5 mg by mouth 2 (two) times daily.     metoprolol tartrate (LOPRESSOR) 50 MG tablet Take 50 mg by mouth daily.     montelukast (SINGULAIR) 10 MG tablet Take 1 tablet by mouth daily.  Multiple Vitamins-Minerals (MULTIVITAMIN WITH MINERALS) tablet Take 1 tablet by mouth daily.     OVER THE COUNTER MEDICATION Hempvana cream as needed for arthritis_0      oxyCODONE (OXY IR/ROXICODONE) 5 MG immediate release tablet Take 1 tablet every 4-6 hours by oral route as needed for pain for 7 days. (Patient not taking: Reported on 07/10/2022)     VALSARTAN PO Take by mouth. Valsartan 12.65m, 1/2 tablet daily     valsartan-hydrochlorothiazide (DIOVAN-HCT) 80-12.5 MG tablet Take 0.5 tablets by mouth daily.     Vitamin D, Ergocalciferol, (DRISDOL) 1.25 MG (50000 UNIT) CAPS capsule Take 50,000 Units by mouth once a week.     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 She is healing well and will not need radiation or chemotherapy. She was unable to tolerate hormonal therapy. Abnormal mammogram left breast August 2023, pathology report from 04/08/2022 was benign.  Normal bone density. She will be due for repeat examination in January 2025   Plan: She was placed on anastrozole back in October but was unable to tolerate this due to severe arthralgias and muscle cramps. We discussed alternatives but she was unable to tolerate letrozole either. She wishes to avoid tamoxifen in view of potential thrombosis.  We will not try to pursue further hormonal therapy.  At least her risk is low for recurrence with a T1b, grade 1 tumor. Otherwise, we will see her back in 4 months for follow up exam. She knows she can call for any concerns regarding her breast cancer.  I provided 20 minutes of face-to-face time during this this encounter and > 50% was spent counseling as documented under my assessment and plan.    CDerwood Kaplan MD [facility] [address]Dept: [phone] Fax: [phone]    I,Alexis Herring,acting as a scribe for CDerwood Kaplan MD.,have documented all relevant documentation on the behalf of CDerwood Kaplan MD,as directed by  CDerwood Kaplan MD while in the presence of CDerwood Kaplan MD.

## 2022-07-15 DIAGNOSIS — M19049 Primary osteoarthritis, unspecified hand: Secondary | ICD-10-CM | POA: Insufficient documentation

## 2022-07-15 DIAGNOSIS — M1811 Unilateral primary osteoarthritis of first carpometacarpal joint, right hand: Secondary | ICD-10-CM | POA: Diagnosis not present

## 2022-07-15 HISTORY — DX: Primary osteoarthritis, unspecified hand: M19.049

## 2022-07-20 DIAGNOSIS — M25562 Pain in left knee: Secondary | ICD-10-CM

## 2022-07-20 HISTORY — DX: Pain in left knee: M25.562

## 2022-07-29 DIAGNOSIS — M1811 Unilateral primary osteoarthritis of first carpometacarpal joint, right hand: Secondary | ICD-10-CM | POA: Diagnosis not present

## 2022-07-29 DIAGNOSIS — M79641 Pain in right hand: Secondary | ICD-10-CM | POA: Diagnosis not present

## 2022-08-05 DIAGNOSIS — M25641 Stiffness of right hand, not elsewhere classified: Secondary | ICD-10-CM | POA: Diagnosis not present

## 2022-08-06 ENCOUNTER — Encounter: Payer: Self-pay | Admitting: Oncology

## 2022-08-11 DIAGNOSIS — H2703 Aphakia, bilateral: Secondary | ICD-10-CM | POA: Diagnosis not present

## 2022-08-12 DIAGNOSIS — M25641 Stiffness of right hand, not elsewhere classified: Secondary | ICD-10-CM | POA: Diagnosis not present

## 2022-08-18 DIAGNOSIS — M25562 Pain in left knee: Secondary | ICD-10-CM | POA: Diagnosis not present

## 2022-08-18 DIAGNOSIS — M25641 Stiffness of right hand, not elsewhere classified: Secondary | ICD-10-CM | POA: Diagnosis not present

## 2022-08-26 DIAGNOSIS — Z4789 Encounter for other orthopedic aftercare: Secondary | ICD-10-CM | POA: Diagnosis not present

## 2022-08-28 DIAGNOSIS — M545 Low back pain, unspecified: Secondary | ICD-10-CM | POA: Insufficient documentation

## 2022-08-28 HISTORY — DX: Low back pain, unspecified: M54.50

## 2022-09-08 DIAGNOSIS — M25641 Stiffness of right hand, not elsewhere classified: Secondary | ICD-10-CM | POA: Diagnosis not present

## 2022-09-11 DIAGNOSIS — M47896 Other spondylosis, lumbar region: Secondary | ICD-10-CM | POA: Diagnosis not present

## 2022-09-11 DIAGNOSIS — M545 Low back pain, unspecified: Secondary | ICD-10-CM | POA: Diagnosis not present

## 2022-09-11 DIAGNOSIS — M47816 Spondylosis without myelopathy or radiculopathy, lumbar region: Secondary | ICD-10-CM

## 2022-09-11 HISTORY — DX: Spondylosis without myelopathy or radiculopathy, lumbar region: M47.816

## 2022-09-15 DIAGNOSIS — M25641 Stiffness of right hand, not elsewhere classified: Secondary | ICD-10-CM | POA: Diagnosis not present

## 2022-09-17 DIAGNOSIS — I10 Essential (primary) hypertension: Secondary | ICD-10-CM | POA: Diagnosis not present

## 2022-09-17 DIAGNOSIS — E785 Hyperlipidemia, unspecified: Secondary | ICD-10-CM | POA: Diagnosis not present

## 2022-09-17 DIAGNOSIS — M1990 Unspecified osteoarthritis, unspecified site: Secondary | ICD-10-CM | POA: Diagnosis not present

## 2022-09-17 DIAGNOSIS — R7303 Prediabetes: Secondary | ICD-10-CM | POA: Diagnosis not present

## 2022-09-17 DIAGNOSIS — Z139 Encounter for screening, unspecified: Secondary | ICD-10-CM | POA: Diagnosis not present

## 2022-10-14 DIAGNOSIS — M65331 Trigger finger, right middle finger: Secondary | ICD-10-CM | POA: Diagnosis not present

## 2022-10-14 DIAGNOSIS — M1811 Unilateral primary osteoarthritis of first carpometacarpal joint, right hand: Secondary | ICD-10-CM | POA: Diagnosis not present

## 2022-10-14 DIAGNOSIS — Z4789 Encounter for other orthopedic aftercare: Secondary | ICD-10-CM | POA: Diagnosis not present

## 2022-10-15 DIAGNOSIS — R52 Pain, unspecified: Secondary | ICD-10-CM

## 2022-10-15 DIAGNOSIS — M65331 Trigger finger, right middle finger: Secondary | ICD-10-CM | POA: Insufficient documentation

## 2022-10-15 HISTORY — DX: Pain, unspecified: R52

## 2022-10-15 HISTORY — DX: Trigger finger, right middle finger: M65.331

## 2022-11-09 ENCOUNTER — Inpatient Hospital Stay: Payer: Medicare Other | Attending: Oncology | Admitting: Oncology

## 2022-11-09 VITALS — BP 156/57 | HR 65 | Temp 98.2°F | Resp 16 | Ht 68.0 in | Wt 245.0 lb

## 2022-11-09 DIAGNOSIS — Z170 Estrogen receptor positive status [ER+]: Secondary | ICD-10-CM | POA: Diagnosis not present

## 2022-11-09 DIAGNOSIS — C50412 Malignant neoplasm of upper-outer quadrant of left female breast: Secondary | ICD-10-CM

## 2022-11-09 NOTE — Progress Notes (Signed)
[facility]  [address] [place],  [place]  [address] [phone]  Clinic Day: 11/09/22  Referring physician: Lucianne Lei, MD  CHIEF COMPLAINT:  CC: Stage IA left breast cancer  Current Treatment: Hormonal therapy  HISTORY OF PRESENT ILLNESS:  Meghan Carpenter is a 77 y.o. 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 This was found on screening bilateral mammogram from July 2022, which revealed a possible distortion in the left breast. Unilateral diagnostic left mammogram and ultrasound was pursued and confirmed a suspicious mass with distortion in the upper-outer left breast without a definite sonographic correlate. There was no evidence of left axillary lymphadenopathy. 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 HER2 was negative (1+). Estrogen and progesterone receptors were both positive at 95%. Ki67 was 5%. She was referred to Dr. Lequita Halt and underwent left breast lumpectomy and left axillary sentinel lymph node biopsy on September 15th. 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 All margins negative for invasive carcinoma and DCIS. One regional lymph node was negative for malignancy (0/1). She was placed on endocrine therapy with anastrozole in October 2022.  She had menarche at age 61 and menopause at age 46.  She was on hormone replacement therapy for 3-4 years. She had a hysterectomy and bilateral salping oophorectomy in 2000 for fibroids. She has had 2 live births, the first at age 84. The radiation oncologist did not feel radiotherapy was necessary. She is up to date on colonoscopy.  INTERVAL HISTORY:  Meghan Carpenter is here for routine follow up for stage IA left breast cancer.Patient states she is well  and complains of itching on her right breast. She was unable to tolerate Anastrozole or Letrozole. Her last mammogram was in August, 2023. Her latest bone density scan was in January, 2023 which revealed to be normal. I will see her back in 4 months with reevaluation. She denies signs of infection such as sore throat, sinus drainage, cough, or urinary symptoms.  She denies fevers or recurrent chills. She denies pain. She denies nausea, vomiting, chest pain, dyspnea or cough. Her appetite is good and her weight has increased 2 pounds over last 4 months .  REVIEW OF SYSTEMS:  Review of Systems  Constitutional:  Negative for appetite change, chills, diaphoresis, fatigue, fever and unexpected weight change.  HENT:  Negative.  Negative for hearing loss, lump/mass, mouth sores, nosebleeds, sore throat, tinnitus, trouble swallowing and voice change.   Eyes: Negative.  Negative for eye problems and icterus.  Respiratory: Negative.  Negative for chest tightness, cough, hemoptysis, shortness of breath and wheezing.   Cardiovascular: Negative.  Negative for chest pain, leg swelling and palpitations.  Gastrointestinal: Negative.  Negative for abdominal distention, abdominal pain, blood in stool, constipation, diarrhea, nausea, rectal pain and vomiting.  Endocrine: Negative.   Genitourinary: Negative.  Negative for bladder incontinence, difficulty urinating, dyspareunia, dysuria, frequency, hematuria, menstrual problem, nocturia, pelvic pain, vaginal bleeding and vaginal discharge.   Musculoskeletal:  Positive for arthralgias (right hand s/p surgery). Negative for back pain, flank pain, gait problem, myalgias, neck pain and neck stiffness.  Skin:  Positive for itching (right breast). Negative for rash and wound.  Neurological:  Negative for dizziness, extremity weakness, gait problem, headaches, light-headedness, numbness, seizures and speech difficulty.  Hematological: Negative.  Negative for adenopathy. Does not  bruise/bleed easily.  Psychiatric/Behavioral: Negative.  Negative for confusion, decreased concentration, depression, sleep disturbance and suicidal ideas. The patient is not nervous/anxious.      VITALS:  Blood pressure (!) 156/57, pulse 65, temperature 98.2 F (36.8 C), temperature source Oral, resp. rate 16, height 5\' 8"  (1.727 m), weight 245 lb (111.1 kg), SpO2 100 %.  Wt Readings from Last 3 Encounters:  11/09/22 245 lb (111.1 kg)  07/10/22 243 lb 9.6 oz (110.5 kg)  03/09/22 248 lb 6.4 oz (112.7 kg)    Body mass index is 37.25 kg/m.  Performance status (ECOG): 1 - Symptomatic but completely ambulatory  PHYSICAL EXAM:  Physical Exam Vitals and nursing note reviewed.  Constitutional:      General: She is not in acute distress.    Appearance: Normal appearance. She is normal weight. She is not ill-appearing, toxic-appearing or diaphoretic.  HENT:     Head: Normocephalic and atraumatic.     Right Ear: Tympanic membrane, ear canal and external ear normal. There is no impacted cerumen.     Left Ear: Tympanic membrane, ear canal and external ear normal. There is no impacted cerumen.     Nose: Nose normal. No congestion or rhinorrhea.     Mouth/Throat:     Mouth: Mucous membranes are moist.     Pharynx: Oropharynx is clear. No oropharyngeal exudate or posterior oropharyngeal erythema.  Eyes:     General: No scleral icterus.       Right eye: No discharge.        Left eye: No discharge.     Extraocular Movements: Extraocular movements intact.     Conjunctiva/sclera: Conjunctivae normal.     Pupils: Pupils are equal, round, and reactive to light.  Neck:     Vascular: No carotid bruit.  Cardiovascular:     Rate and Rhythm: Normal rate and regular rhythm.     Pulses: Normal pulses.     Heart sounds: Normal heart sounds. No murmur heard.    No friction rub. No gallop.  Pulmonary:     Effort: Pulmonary effort is normal. No respiratory distress.     Breath sounds: Normal breath  sounds. No stridor. No wheezing, rhonchi or rales.  Chest:     Chest wall: No tenderness.     Comments: Well healed scar in the upper outer quadrant of the left breast in the left axilla. No masses in either breast. Small raised redden areas of the right breast in the areolar complex about 3 that are non specific.   Abdominal:     General: Bowel sounds are normal. There is no distension.     Palpations: Abdomen is soft. There is no hepatomegaly, splenomegaly or mass.     Tenderness: There is no abdominal tenderness. There is no right CVA tenderness, left CVA tenderness, guarding or rebound.     Hernia: No hernia is present.  Musculoskeletal:        General: No swelling, tenderness, deformity or signs of injury. Normal range of motion.     Cervical back: Normal range of motion and neck supple. No rigidity or tenderness.     Right lower leg: No edema.     Left lower leg: No edema.  Lymphadenopathy:     Cervical: No cervical adenopathy.  Skin:    General: Skin is warm and dry.     Coloration: Skin is not jaundiced or pale.     Findings: No bruising,  erythema, lesion or rash.  Neurological:     General: No focal deficit present.     Mental Status: She is alert and oriented to person, place, and time. Mental status is at baseline.     Cranial Nerves: No cranial nerve deficit.     Sensory: No sensory deficit.     Motor: No weakness.     Coordination: Coordination normal.     Gait: Gait normal.     Deep Tendon Reflexes: Reflexes normal.  Psychiatric:        Mood and Affect: Mood normal.        Behavior: Behavior normal.        Thought Content: Thought content normal.        Judgment: Judgment normal.    LABS:      Latest Ref Rng & Units 06/16/2021   12:00 AM 02/20/2011   12:38 PM  CBC  WBC  11.7       Hemoglobin 12.0 - 16.0 12.7     14.6   Hematocrit 36 - 46 38       Platelets 150 - 399 258          This result is from an external source.      Latest Ref Rng & Units  06/16/2021   12:00 AM  CMP  BUN 4 - 21 14      Creatinine 0.5 - 1.1 0.8      Sodium 137 - 147 139      Potassium 3.4 - 5.3 4.1      Chloride 99 - 108 104      CO2 13 - 22 30      Calcium 8.7 - 10.7 9.3      Alkaline Phos 25 - 125 110      AST 13 - 35 33      ALT 7 - 35 23         This result is from an external source.    STUDIES:  No results found.    Pathology report 04/08/2022 Atrophic breast tissue with patchy fibrosis, fat necrosis, cystic change and foreign body reaction. No atypia, in situ or invasive malignancy identified.  Exam 04/01/2022 Digital Diagnostic Bilateral Mammogram with Tomosynthesis, Ultrasound Left Breast Impression: Complex cystic and solid mass in the left breast at the 1 oclock axis, 6cm from the nipple, measuring 1.5cm, corresponding to a new mass seen on mammogram in the vicinity of the earlier lumpectomy site. This may represent fat necrosis or postsurgical seroma. US-guided biopsy is recommended to exclude malignancy. No evidence of malignancy within the right breast.   Bone density scan 09/05/2021     HISTORY:   Allergies:  Allergies  Allergen Reactions   Misc. Sulfonamide Containing Compounds Other (See Comments)   Sulfa Antibiotics Nausea Only    Current Medications: Current Outpatient Medications  Medication Sig Dispense Refill   aspirin EC 81 MG tablet Take 81 mg by mouth 2 (two) times a week. Swallow whole.     baclofen (LIORESAL) 10 MG tablet Take 10 mg by mouth 2 (two) times daily.     cetirizine (ZYRTEC) 10 MG tablet Take 10 mg by mouth daily.     cloNIDine (CATAPRES) 0.1 MG tablet 0.1mg  1/2 tablet daily     Coenzyme Q10 100 MG TABS Take 100 mg by mouth 2 (two) times daily.     ezetimibe (ZETIA) 10 MG tablet Take 1 tablet by mouth at bedtime for elevated lipids.  furosemide (LASIX) 20 MG tablet      methocarbamol (ROBAXIN) 500 MG tablet Take 1 tablet every 6-8 hours by oral route as needed for spasm for 10 days.      metoprolol tartrate (LOPRESSOR) 50 MG tablet Take 50 mg by mouth daily.     montelukast (SINGULAIR) 10 MG tablet Take 1 tablet by mouth daily.     Multiple Vitamins-Minerals (MULTIVITAMIN WITH MINERALS) tablet Take 1 tablet by mouth daily.     OVER THE COUNTER MEDICATION Hempvana cream as needed for arthritis\     oxyCODONE (OXY IR/ROXICODONE) 5 MG immediate release tablet Take 1 tablet every 4-6 hours by oral route as needed for pain for 7 days. (Patient not taking: Reported on 07/10/2022)     VALSARTAN PO Take by mouth. Valsartan 12.5mg , 1/2 tablet daily     valsartan-hydrochlorothiazide (DIOVAN-HCT) 80-12.5 MG tablet Take 0.5 tablets by mouth daily.     Vitamin D, Ergocalciferol, (DRISDOL) 1.25 MG (50000 UNIT) CAPS capsule Take 50,000 Units by mouth once a week.     ZOFRAN 4 MG tablet Take 2 tablets every 8 hours by oral route.     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 She will not need radiation or chemotherapy. She was unable to tolerate hormonal therapy. She had an abnormal mammogram of the left breast August 2023, but pathology report from 04/08/2022 was benign.  Normal bone density. She will be due for repeat examination in January 2025   Plan: She did not tolerate Anastrozole or Letrozole. I will see her back in 4 months with reevaluation.  She wishes to avoid tamoxifen in view of potential for thrombosis. At least her risk is low for recurrence with a T1b, grade 1 tumor. Otherwise, we will see her back in 4 months for follow up exam. She knows she can call for any concerns regarding her breast cancer.  I provided 10 minutes of face-to-face time during  this this encounter and > 50% was spent counseling as documented under my assessment and plan.    Dellia Beckwith, MD [facility] AT [facility] [address] [place] [address] Dept: [phone] Dept Fax: [phone]    Rulon Sera Lassiter,acting as a scribe for Dellia Beckwith, MD.,have documented all relevant documentation on the behalf of Dellia Beckwith, MD,as directed by  Dellia Beckwith, MD while in the presence of Dellia Beckwith, MD.

## 2022-12-04 ENCOUNTER — Encounter: Payer: Self-pay | Admitting: Oncology

## 2022-12-04 DIAGNOSIS — L298 Other pruritus: Secondary | ICD-10-CM | POA: Diagnosis not present

## 2022-12-04 DIAGNOSIS — L821 Other seborrheic keratosis: Secondary | ICD-10-CM | POA: Diagnosis not present

## 2022-12-04 DIAGNOSIS — L820 Inflamed seborrheic keratosis: Secondary | ICD-10-CM | POA: Diagnosis not present

## 2022-12-04 DIAGNOSIS — L814 Other melanin hyperpigmentation: Secondary | ICD-10-CM | POA: Diagnosis not present

## 2022-12-04 DIAGNOSIS — D225 Melanocytic nevi of trunk: Secondary | ICD-10-CM | POA: Diagnosis not present

## 2022-12-17 DIAGNOSIS — M1990 Unspecified osteoarthritis, unspecified site: Secondary | ICD-10-CM | POA: Diagnosis not present

## 2022-12-17 DIAGNOSIS — Z79899 Other long term (current) drug therapy: Secondary | ICD-10-CM | POA: Diagnosis not present

## 2022-12-17 DIAGNOSIS — N3281 Overactive bladder: Secondary | ICD-10-CM | POA: Diagnosis not present

## 2022-12-17 DIAGNOSIS — E785 Hyperlipidemia, unspecified: Secondary | ICD-10-CM | POA: Diagnosis not present

## 2022-12-17 DIAGNOSIS — J309 Allergic rhinitis, unspecified: Secondary | ICD-10-CM | POA: Diagnosis not present

## 2022-12-17 DIAGNOSIS — E739 Lactose intolerance, unspecified: Secondary | ICD-10-CM | POA: Diagnosis not present

## 2022-12-17 DIAGNOSIS — K219 Gastro-esophageal reflux disease without esophagitis: Secondary | ICD-10-CM | POA: Diagnosis not present

## 2022-12-17 DIAGNOSIS — E559 Vitamin D deficiency, unspecified: Secondary | ICD-10-CM | POA: Diagnosis not present

## 2022-12-17 DIAGNOSIS — I10 Essential (primary) hypertension: Secondary | ICD-10-CM | POA: Diagnosis not present

## 2022-12-17 DIAGNOSIS — E1169 Type 2 diabetes mellitus with other specified complication: Secondary | ICD-10-CM | POA: Diagnosis not present

## 2022-12-18 DIAGNOSIS — M25561 Pain in right knee: Secondary | ICD-10-CM

## 2022-12-18 HISTORY — DX: Pain in right knee: M25.561

## 2022-12-24 DIAGNOSIS — M1711 Unilateral primary osteoarthritis, right knee: Secondary | ICD-10-CM | POA: Diagnosis not present

## 2023-03-05 NOTE — Progress Notes (Signed)
[facility]  [address] [place],  [place]  [address] [phone]  Clinic Day: 03/11/23  Referring physician: Lucianne Lei, MD  CHIEF COMPLAINT:  CC: Stage IA left breast cancer  Current Treatment: Hormonal therapy  HISTORY OF PRESENT ILLNESS:  Meghan Carpenter is a 77 y.o. 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 This was found on screening bilateral mammogram from July 2022, which revealed a possible distortion in the left breast. Unilateral diagnostic left mammogram and ultrasound was pursued and confirmed a suspicious mass with distortion in the upper-outer left breast without a definite sonographic correlate. There was no evidence of left axillary lymphadenopathy. 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 HER2 was negative (1+). Estrogen and progesterone receptors were both positive at 95%. Ki67 was 5%. She was referred to Dr. Lequita Halt and underwent left breast lumpectomy and left axillary sentinel lymph node biopsy on September 15th. 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 All margins negative for invasive carcinoma and DCIS. One regional lymph node was negative for malignancy (0/1). She was placed on endocrine therapy with anastrozole in October 2022.  She had menarche at age 21 and menopause at age 41.  She was on hormone replacement therapy for 3-4 years. She had a hysterectomy and bilateral salping oophorectomy in 2000 for fibroids. She has had 2 live births, the first at age 4. The radiation oncologist did not feel radiotherapy was necessary. She is up to date on colonoscopy. She was unable to tolerate Anastrozole or Letrozole.  INTERVAL HISTORY:  Meghan Carpenter is here for routine follow up for  stage IA left breast cancer. Patient states that she feels well and has no complaints of pain. Patient informed me that she has a mammogram scheduled on the 14th of August and she will see Dr. Lequita Halt the next day. She is now off of her cholesterol medicine as it caused too many side effects that caused her myalgias. Her PCP does routine blood work on her every 6 months. She informed me that she was informed that she is prediabetic and is working on that. I will see her back in 6 months for reexamination. She denies signs of infection such as sore throat, sinus drainage, cough, or urinary symptoms.  She denies fevers or recurrent chills. She denies pain. She denies nausea, vomiting, chest pain, dyspnea or cough. Her appetite is great and her weight has decreased 11 pounds over last 4 months  REVIEW OF SYSTEMS:  Review of Systems  Constitutional:  Negative for appetite change, chills, diaphoresis, fatigue, fever and unexpected weight change.  HENT:  Negative.  Negative for hearing loss, lump/mass, mouth sores, nosebleeds, sore throat, tinnitus, trouble swallowing and voice change.   Eyes: Negative.  Negative for eye problems and icterus.  Respiratory: Negative.  Negative for chest tightness, cough, hemoptysis, shortness of breath and wheezing.   Cardiovascular: Negative.  Negative for chest pain, leg swelling and palpitations.  Gastrointestinal: Negative.  Negative for abdominal distention, abdominal pain, blood in stool, constipation, diarrhea, nausea, rectal pain and vomiting.  Endocrine: Negative.   Genitourinary: Negative.  Negative for bladder incontinence, difficulty urinating, dyspareunia, dysuria, frequency, hematuria, menstrual problem, nocturia, pelvic pain, vaginal bleeding and vaginal discharge.   Musculoskeletal:  Positive for arthralgias (right hand  s/p surgery) and myalgias. Negative for back pain, flank pain, gait problem, neck pain and neck stiffness.  Skin: Negative.  Negative for  itching, rash and wound.  Neurological:  Negative for dizziness, extremity weakness, gait problem, headaches, light-headedness, numbness, seizures and speech difficulty.  Hematological: Negative.  Negative for adenopathy. Does not bruise/bleed easily.  Psychiatric/Behavioral: Negative.  Negative for confusion, decreased concentration, depression, sleep disturbance and suicidal ideas. The patient is not nervous/anxious.      VITALS:  Blood pressure (!) 143/55, pulse (!) 55, temperature 98 F (36.7 C), temperature source Oral, resp. rate 18, height 5\' 8"  (1.727 m), weight 234 lb 3.2 oz (106.2 kg), SpO2 98%.  Wt Readings from Last 3 Encounters:  03/11/23 234 lb 3.2 oz (106.2 kg)  11/09/22 245 lb (111.1 kg)  07/10/22 243 lb 9.6 oz (110.5 kg)    Body mass index is 35.61 kg/m.  Performance status (ECOG): 1 - Symptomatic but completely ambulatory  PHYSICAL EXAM:  Physical Exam Vitals and nursing note reviewed.  Constitutional:      General: She is not in acute distress.    Appearance: Normal appearance. She is normal weight. She is not ill-appearing, toxic-appearing or diaphoretic.  HENT:     Head: Normocephalic and atraumatic.     Right Ear: Tympanic membrane, ear canal and external ear normal. There is no impacted cerumen.     Left Ear: Tympanic membrane, ear canal and external ear normal. There is no impacted cerumen.     Nose: Nose normal. No congestion or rhinorrhea.     Mouth/Throat:     Mouth: Mucous membranes are moist.     Pharynx: Oropharynx is clear. No oropharyngeal exudate or posterior oropharyngeal erythema.  Eyes:     General: No scleral icterus.       Right eye: No discharge.        Left eye: No discharge.     Extraocular Movements: Extraocular movements intact.     Conjunctiva/sclera: Conjunctivae normal.     Pupils: Pupils are equal, round, and reactive to light.  Neck:     Vascular: No carotid bruit.  Cardiovascular:     Rate and Rhythm: Normal rate and regular  rhythm.     Pulses: Normal pulses.     Heart sounds: Normal heart sounds. No murmur heard.    No friction rub. No gallop.  Pulmonary:     Effort: Pulmonary effort is normal. No respiratory distress.     Breath sounds: Normal breath sounds. No stridor. No wheezing, rhonchi or rales.  Chest:     Chest wall: No tenderness.     Comments: Faint scar in the upper outer quadrant of the left breast and another in the left axilla. Both are healing well.  No masses in either breast. Abdominal:     General: Bowel sounds are normal. There is no distension.     Palpations: Abdomen is soft. There is no hepatomegaly, splenomegaly or mass.     Tenderness: There is no abdominal tenderness. There is no right CVA tenderness, left CVA tenderness, guarding or rebound.     Hernia: No hernia is present.  Musculoskeletal:        General: No swelling, tenderness, deformity or signs of injury. Normal range of motion.     Cervical back: Normal range of motion and neck supple. No rigidity or tenderness.     Right lower leg: No edema.     Left lower leg: No edema.  Lymphadenopathy:  Cervical: No cervical adenopathy.  Skin:    General: Skin is warm and dry.     Coloration: Skin is not jaundiced or pale.     Findings: No bruising, erythema, lesion or rash.  Neurological:     General: No focal deficit present.     Mental Status: She is alert and oriented to person, place, and time. Mental status is at baseline.     Cranial Nerves: No cranial nerve deficit.     Sensory: No sensory deficit.     Motor: No weakness.     Coordination: Coordination normal.     Gait: Gait normal.     Deep Tendon Reflexes: Reflexes normal.  Psychiatric:        Mood and Affect: Mood normal.        Behavior: Behavior normal.        Thought Content: Thought content normal.        Judgment: Judgment normal.    LABS:      Latest Ref Rng & Units 06/16/2021   12:00 AM 02/20/2011   12:38 PM  CBC  WBC  11.7       Hemoglobin 12.0  - 16.0 12.7     14.6   Hematocrit 36 - 46 38       Platelets 150 - 399 258          This result is from an external source.      Latest Ref Rng & Units 06/16/2021   12:00 AM  CMP  BUN 4 - 21 14      Creatinine 0.5 - 1.1 0.8      Sodium 137 - 147 139      Potassium 3.4 - 5.3 4.1      Chloride 99 - 108 104      CO2 13 - 22 30      Calcium 8.7 - 10.7 9.3      Alkaline Phos 25 - 125 110      AST 13 - 35 33      ALT 7 - 35 23         This result is from an external source.    STUDIES:  No results found.    Pathology report 04/08/2022 Atrophic breast tissue with patchy fibrosis, fat necrosis, cystic change and foreign body reaction. No atypia, in situ or invasive malignancy identified.  Exam 04/01/2022 Digital Diagnostic Bilateral Mammogram with Tomosynthesis, Ultrasound Left Breast Impression: Complex cystic and solid mass in the left breast at the 1 oclock axis, 6cm from the nipple, measuring 1.5cm, corresponding to a new mass seen on mammogram in the vicinity of the earlier lumpectomy site. This may represent fat necrosis or postsurgical seroma. US-guided biopsy is recommended to exclude malignancy. No evidence of malignancy within the right breast.   HISTORY:   Allergies:  Allergies  Allergen Reactions   Misc. Sulfonamide Containing Compounds Other (See Comments)   Sulfa Antibiotics Nausea Only    Current Medications: Current Outpatient Medications  Medication Sig Dispense Refill   aspirin EC 81 MG tablet Take 81 mg by mouth 2 (two) times a week. Swallow whole.     baclofen (LIORESAL) 10 MG tablet Take 10 mg by mouth 2 (two) times daily.     cetirizine (ZYRTEC) 10 MG tablet Take 10 mg by mouth daily.     cloNIDine (CATAPRES) 0.1 MG tablet 0.1mg  1/2 tablet daily     Coenzyme Q10 100 MG TABS Take 100 mg by mouth 2 (  two) times daily.     furosemide (LASIX) 20 MG tablet      methocarbamol (ROBAXIN) 500 MG tablet Take 1 tablet every 6-8 hours by oral route as needed for  spasm for 10 days.     metoprolol tartrate (LOPRESSOR) 50 MG tablet Take 50 mg by mouth daily.     montelukast (SINGULAIR) 10 MG tablet Take 1 tablet by mouth daily.     Multiple Vitamins-Minerals (MULTIVITAMIN WITH MINERALS) tablet Take 1 tablet by mouth daily.     OVER THE COUNTER MEDICATION Hempvana cream as needed for arthritis\     oxyCODONE (OXY IR/ROXICODONE) 5 MG immediate release tablet Take 1 tablet every 4-6 hours by oral route as needed for pain for 7 days. (Patient not taking: Reported on 07/10/2022)     VALSARTAN PO Take by mouth. Valsartan 12.5mg , 1/2 tablet daily     valsartan-hydrochlorothiazide (DIOVAN-HCT) 80-12.5 MG tablet Take 0.5 tablets by mouth daily.     Vitamin D, Ergocalciferol, (DRISDOL) 1.25 MG (50000 UNIT) CAPS capsule Take 50,000 Units by mouth once a week.     ZOFRAN 4 MG tablet Take 2 tablets every 8 hours by oral route.     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 She will not need radiation or chemotherapy. She was unable to tolerate hormonal therapy. She had an abnormal mammogram of the left breast August 2023, but pathology report from 04/08/2022 was benign.  Normal bone density. She will be due for repeat examination in January 2025   Plan: She did not tolerate Anastrozole or Letrozole.  She wishes to avoid tamoxifen in view of potential for thrombosis. Patient informed me that she has a mammogram scheduled on the 14th of August and she will see Dr. Lequita Halt the next day. She is now off of her cholesterol medicine as it caused too many side effects that caused her myalgias. Her PCP does routine blood work on her every 6 months. She  informed me that she was informed that she is prediabetic and is working on that. I will see her back in 6 months for reexamination. She knows she can call for any concerns regarding her breast cancer.  I provided 11 minutes of face-to-face time during this this encounter and > 50% was spent counseling as documented under my assessment and plan.    Dellia Beckwith, MD [facility] [address] [place] [address] Dept: [phone] Dept Fax: [phone]    Rulon Sera Lassiter,acting as a scribe for Dellia Beckwith, MD.,have documented all relevant documentation on the behalf of Dellia Beckwith, MD,as directed by  Dellia Beckwith, MD while in the presence of Dellia Beckwith, MD.

## 2023-03-11 ENCOUNTER — Encounter: Payer: Self-pay | Admitting: Oncology

## 2023-03-11 ENCOUNTER — Inpatient Hospital Stay: Payer: Medicare Other | Attending: Oncology | Admitting: Oncology

## 2023-03-11 VITALS — BP 143/55 | HR 55 | Temp 98.0°F | Resp 18 | Ht 68.0 in | Wt 234.2 lb

## 2023-03-11 DIAGNOSIS — C50412 Malignant neoplasm of upper-outer quadrant of left female breast: Secondary | ICD-10-CM | POA: Diagnosis not present

## 2023-03-11 DIAGNOSIS — Z170 Estrogen receptor positive status [ER+]: Secondary | ICD-10-CM

## 2023-04-08 DIAGNOSIS — E559 Vitamin D deficiency, unspecified: Secondary | ICD-10-CM | POA: Diagnosis not present

## 2023-04-08 DIAGNOSIS — J309 Allergic rhinitis, unspecified: Secondary | ICD-10-CM | POA: Diagnosis not present

## 2023-04-08 DIAGNOSIS — I10 Essential (primary) hypertension: Secondary | ICD-10-CM | POA: Diagnosis not present

## 2023-04-08 DIAGNOSIS — N3281 Overactive bladder: Secondary | ICD-10-CM | POA: Diagnosis not present

## 2023-04-08 DIAGNOSIS — M1990 Unspecified osteoarthritis, unspecified site: Secondary | ICD-10-CM | POA: Diagnosis not present

## 2023-04-08 DIAGNOSIS — H68003 Unspecified Eustachian salpingitis, bilateral: Secondary | ICD-10-CM | POA: Diagnosis not present

## 2023-04-08 DIAGNOSIS — E1169 Type 2 diabetes mellitus with other specified complication: Secondary | ICD-10-CM | POA: Diagnosis not present

## 2023-04-08 DIAGNOSIS — E785 Hyperlipidemia, unspecified: Secondary | ICD-10-CM | POA: Diagnosis not present

## 2023-04-15 DIAGNOSIS — C50412 Malignant neoplasm of upper-outer quadrant of left female breast: Secondary | ICD-10-CM | POA: Diagnosis not present

## 2023-04-15 DIAGNOSIS — R92323 Mammographic fibroglandular density, bilateral breasts: Secondary | ICD-10-CM | POA: Diagnosis not present

## 2023-04-15 LAB — HM MAMMOGRAPHY

## 2023-04-21 DIAGNOSIS — Z853 Personal history of malignant neoplasm of breast: Secondary | ICD-10-CM | POA: Diagnosis not present

## 2023-05-05 DIAGNOSIS — J069 Acute upper respiratory infection, unspecified: Secondary | ICD-10-CM | POA: Diagnosis not present

## 2023-05-25 ENCOUNTER — Encounter: Payer: Self-pay | Admitting: Surgery

## 2023-05-31 DIAGNOSIS — E119 Type 2 diabetes mellitus without complications: Secondary | ICD-10-CM | POA: Diagnosis not present

## 2023-06-29 DIAGNOSIS — Z9181 History of falling: Secondary | ICD-10-CM | POA: Diagnosis not present

## 2023-06-29 DIAGNOSIS — Z0000 Encounter for general adult medical examination without abnormal findings: Secondary | ICD-10-CM | POA: Diagnosis not present

## 2023-07-01 DIAGNOSIS — M65331 Trigger finger, right middle finger: Secondary | ICD-10-CM | POA: Diagnosis not present

## 2023-07-01 DIAGNOSIS — Z4789 Encounter for other orthopedic aftercare: Secondary | ICD-10-CM | POA: Diagnosis not present

## 2023-07-02 HISTORY — PX: TRIGGER FINGER RELEASE: SHX641

## 2023-07-12 ENCOUNTER — Encounter: Payer: Self-pay | Admitting: Gastroenterology

## 2023-07-14 DIAGNOSIS — M79644 Pain in right finger(s): Secondary | ICD-10-CM

## 2023-07-14 DIAGNOSIS — M65331 Trigger finger, right middle finger: Secondary | ICD-10-CM | POA: Diagnosis not present

## 2023-07-14 HISTORY — DX: Pain in right finger(s): M79.644

## 2023-07-28 DIAGNOSIS — M65331 Trigger finger, right middle finger: Secondary | ICD-10-CM | POA: Diagnosis not present

## 2023-08-05 DIAGNOSIS — K219 Gastro-esophageal reflux disease without esophagitis: Secondary | ICD-10-CM | POA: Diagnosis not present

## 2023-08-05 DIAGNOSIS — N3281 Overactive bladder: Secondary | ICD-10-CM | POA: Diagnosis not present

## 2023-08-05 DIAGNOSIS — J309 Allergic rhinitis, unspecified: Secondary | ICD-10-CM | POA: Diagnosis not present

## 2023-08-05 DIAGNOSIS — I10 Essential (primary) hypertension: Secondary | ICD-10-CM | POA: Diagnosis not present

## 2023-08-05 DIAGNOSIS — E739 Lactose intolerance, unspecified: Secondary | ICD-10-CM | POA: Diagnosis not present

## 2023-08-05 DIAGNOSIS — M7072 Other bursitis of hip, left hip: Secondary | ICD-10-CM | POA: Diagnosis not present

## 2023-08-05 DIAGNOSIS — E785 Hyperlipidemia, unspecified: Secondary | ICD-10-CM | POA: Diagnosis not present

## 2023-08-05 DIAGNOSIS — E1169 Type 2 diabetes mellitus with other specified complication: Secondary | ICD-10-CM | POA: Diagnosis not present

## 2023-08-05 DIAGNOSIS — M1990 Unspecified osteoarthritis, unspecified site: Secondary | ICD-10-CM | POA: Diagnosis not present

## 2023-08-05 DIAGNOSIS — M7989 Other specified soft tissue disorders: Secondary | ICD-10-CM | POA: Diagnosis not present

## 2023-08-09 DIAGNOSIS — M65331 Trigger finger, right middle finger: Secondary | ICD-10-CM | POA: Diagnosis not present

## 2023-08-20 ENCOUNTER — Encounter: Payer: Self-pay | Admitting: Gastroenterology

## 2023-08-31 ENCOUNTER — Ambulatory Visit (AMBULATORY_SURGERY_CENTER): Payer: Medicare Other

## 2023-08-31 VITALS — Ht 68.0 in | Wt 236.0 lb

## 2023-08-31 DIAGNOSIS — Z860100 Personal history of colon polyps, unspecified: Secondary | ICD-10-CM

## 2023-08-31 DIAGNOSIS — Z800 Family history of malignant neoplasm of digestive organs: Secondary | ICD-10-CM

## 2023-08-31 MED ORDER — NA SULFATE-K SULFATE-MG SULF 17.5-3.13-1.6 GM/177ML PO SOLN: 1.0000 | Freq: Once | ORAL | 0 refills | Status: DC

## 2023-08-31 MED ORDER — NA SULFATE-K SULFATE-MG SULF 17.5-3.13-1.6 GM/177ML PO SOLN: 1.0000 | Freq: Once | ORAL | 0 refills | Status: AC

## 2023-08-31 NOTE — Progress Notes (Signed)
No egg or soy allergy known to patient   No issues known to pt with past sedation with any surgeries or procedures  Patient denies ever being told they had issues or difficulty with intubation   No FH of Malignant Hyperthermia  Pt is not on diet pills  Pt is not on  home 02   Pt is not on blood thinners   Pt denies issues with constipation   No A fib or A flutter  Have any cardiac testing pending--no Pt instructed to use [employer] or GoodRx for a price reduction on prep     Patient's chart reviewed by John Nulty CRNA prior to previsit and patient appropriate for the [facility].  Previsit completed and red dot placed by patient's name on their procedure day (on provider's schedule).    

## 2023-09-08 ENCOUNTER — Encounter: Payer: Self-pay | Admitting: Gastroenterology

## 2023-09-08 ENCOUNTER — Encounter: Payer: Self-pay | Admitting: Certified Registered Nurse Anesthetist

## 2023-09-09 ENCOUNTER — Encounter: Payer: Self-pay | Admitting: Gastroenterology

## 2023-09-09 ENCOUNTER — Ambulatory Visit (AMBULATORY_SURGERY_CENTER): Payer: Medicare Other | Admitting: Gastroenterology

## 2023-09-09 VITALS — BP 174/61 | HR 70 | Temp 97.8°F | Resp 11 | Ht 68.0 in | Wt 236.0 lb

## 2023-09-09 DIAGNOSIS — Z800 Family history of malignant neoplasm of digestive organs: Secondary | ICD-10-CM | POA: Diagnosis not present

## 2023-09-09 DIAGNOSIS — D125 Benign neoplasm of sigmoid colon: Secondary | ICD-10-CM | POA: Diagnosis not present

## 2023-09-09 DIAGNOSIS — K640 First degree hemorrhoids: Secondary | ICD-10-CM | POA: Diagnosis not present

## 2023-09-09 DIAGNOSIS — Z860100 Personal history of colon polyps, unspecified: Secondary | ICD-10-CM

## 2023-09-09 DIAGNOSIS — K5730 Diverticulosis of large intestine without perforation or abscess without bleeding: Secondary | ICD-10-CM

## 2023-09-09 DIAGNOSIS — K635 Polyp of colon: Secondary | ICD-10-CM | POA: Diagnosis not present

## 2023-09-09 DIAGNOSIS — E785 Hyperlipidemia, unspecified: Secondary | ICD-10-CM | POA: Diagnosis not present

## 2023-09-09 DIAGNOSIS — Z1211 Encounter for screening for malignant neoplasm of colon: Secondary | ICD-10-CM | POA: Diagnosis not present

## 2023-09-09 MED ORDER — SODIUM CHLORIDE 0.9 % IV SOLN: 500.0000 mL | INTRAVENOUS | Status: DC

## 2023-09-09 NOTE — Progress Notes (Signed)
 Called to room to assist during endoscopic procedure.  Patient ID and intended procedure confirmed with present staff. Received instructions for my participation in the procedure from the performing physician.

## 2023-09-09 NOTE — Progress Notes (Signed)
 Report given to PACU, vss

## 2023-09-09 NOTE — Progress Notes (Signed)
 Pt's states no medical or surgical changes since previsit or office visit.

## 2023-09-09 NOTE — Op Note (Signed)
 [facility] Patient Name: Meghan Carpenter Procedure Date: 09/09/2023 10:57 AM MRN: [id number] Endoscopist: Lynnie Bring , MD, [id number] Age: 78 Referring MD:  Date of Birth: 01/13/1946 Gender: Female Account #: [id number] Procedure:                Colonoscopy Indications:              High risk colon cancer surveillance: Personal                            history of colonic polyps. FH CRC (sis at age 55) Medicines:                Monitored Anesthesia Care Procedure:                Pre-Anesthesia Assessment:                           - Prior to the procedure, a History and Physical                            was performed, and patient medications and                            allergies were reviewed. The patient's tolerance of                            previous anesthesia was also reviewed. The risks                            and benefits of the procedure and the sedation                            options and risks were discussed with the patient.                            All questions were answered, and informed consent                            was obtained. Prior Anticoagulants: The patient has                            taken no anticoagulant or antiplatelet agents. ASA                            Grade Assessment: II - A patient with mild systemic                            disease. After reviewing the risks and benefits,                            the patient was deemed in satisfactory condition to                            undergo the procedure.  After obtaining informed consent, the colonoscope                            was passed under direct vision. Throughout the                            procedure, the patient's blood pressure, pulse, and                            oxygen saturations were monitored continuously. The                            CF [id number] #[id number] was introduced through the anus                            and advanced  to the the cecum, identified by                            appendiceal orifice and ileocecal valve. The                            colonoscopy was performed without difficulty. The                            patient tolerated the procedure well. The quality                            of the bowel preparation was good. The ileocecal                            valve, appendiceal orifice, and rectum were                            photographed. Scope In: 11:11:43 AM Scope Out: 11:23:23 AM Scope Withdrawal Time: 0 hours 7 minutes 52 seconds  Total Procedure Duration: 0 hours 11 minutes 40 seconds  Findings:                 A 4 mm polyp was found in the mid sigmoid colon.                            The polyp was sessile. The polyp was removed with a                            cold snare. Resection and retrieval were complete.                           A few small-mouthed diverticula were found in the                            sigmoid colon.                           Non-bleeding internal hemorrhoids were found during  retroflexion. The hemorrhoids were small and Grade                            I (internal hemorrhoids that do not prolapse).                           The exam was otherwise without abnormality on                            direct and retroflexion views. Complications:            No immediate complications. Estimated Blood Loss:     Estimated blood loss: none. Impression:               - One 4 mm polyp in the mid sigmoid colon, removed                            with a cold snare. Resected and retrieved.                           - Mild sigmoid diverticulosis.                           - Non-bleeding internal hemorrhoids.                           - The examination was otherwise normal on direct                            and retroflexion views. Recommendation:           - Patient has a contact number available for                             emergencies. The signs and symptoms of potential                            delayed complications were discussed with the                            patient. Return to normal activities tomorrow.                            Written discharge instructions were provided to the                            patient.                           - Resume previous diet.                           - Continue present medications.                           - Await pathology results.                           -  Repeat colonoscopy is not recommended for                            surveillance.                           - Return to GI clinic PRN.                           - The findings and recommendations were discussed                            with the patient's family. Lynnie Bring, MD 09/09/2023 11:27:33 AM This report has been signed electronically.

## 2023-09-09 NOTE — Progress Notes (Signed)
 [facility] History and Physical   Primary Care Physician:  [name], MD   Reason for Procedure:   H/o   Polyps/FH CRC-  sister  Plan:     colonoscopy     HPI: Meghan Carpenter is a 78 y.o. female    Past Medical History:  Diagnosis Date   Allergy    Arthritis    Cancer (HCC) 2022   Left breast cancer one lymph node, no chemo no radiation, lumpectemy   Heart murmur    Hyperlipidemia    Hypertension    Seasonal allergies    Vertigo     Past Surgical History:  Procedure Laterality Date   ABDOMINAL HYSTERECTOMY  2000   ANKLE SURGERY Right 2013   BACK SURGERY  1990   BREAST LUMPECTOMY WITH NEEDLE LOCALIZATION AND AXILLARY SENTINEL LYMPH NODE BX Left 2022   one lymph node   COLONOSCOPY  2019   Meghan Carpenter   FOOT SURGERY Right 1997   HAND SURGERY Right 2010   KNEE SURGERY Left 2006   right thumb joint Right 06/30/2022   thumb joint replaced   TOE SURGERY Right 1996   TRIGGER FINGER RELEASE Right 07/2023   middle finger    Prior to Admission medications   Medication Sig Start Date End Date Taking? Authorizing Provider  acetaminophen (TYLENOL) 650 MG CR tablet Take 650 mg by mouth every 8 (eight) hours as needed for pain.   Yes [provider]  cetirizine (ZYRTEC) 10 MG tablet Take 10 mg by mouth daily.   Yes [provider]  Coenzyme Q10 100 MG TABS Take 100 mg by mouth 2 (two) times daily.   Yes [provider]  metoprolol tartrate (LOPRESSOR) 50 MG tablet Take 0.5 tablets by mouth 2 (two) times daily.   Yes [provider]  VALSARTAN PO Take by mouth. Valsartan 12.5mg , 1/2 tablet daily   Yes [provider]  Vitamin D, Ergocalciferol, (DRISDOL) 1.25 MG (50000 UNIT) CAPS capsule Take 50,000 Units by mouth once a week. 03/12/21  Yes [provider]  aspirin EC 81 MG tablet Take 81 mg by mouth 2 (two) times a week. Swallow whole. Patient not taking: Reported on 09/09/2023    [provider]   baclofen (LIORESAL) 10 MG tablet Take 10 mg by mouth 2 (two) times daily as needed. 12/10/21   [provider]  cloNIDine (CATAPRES) 0.1 MG tablet 0.1mg  1/2 tablet daily Patient not taking: Reported on 09/09/2023 02/27/19   [provider]  furosemide (LASIX) 20 MG tablet 20 mg daily as needed.    [provider]  GLUCOSAMINE-CHONDROITIN PO Take by mouth. Patient not taking: Reported on 09/09/2023    [provider]  meclizine (ANTIVERT) 12.5 MG tablet SMARTSIG:1-2 Tablet(s) By Mouth Every 12 Hours PRN    [provider]  methocarbamol (ROBAXIN) 500 MG tablet Take 1 tablet every 6-8 hours by oral route as needed for spasm for 10 days. Patient not taking: Reported on 09/09/2023 06/30/22   [provider]  montelukast (SINGULAIR) 10 MG tablet Take 1 tablet by mouth daily. Patient not taking: Reported on 09/09/2023 11/16/17   [provider]  montelukast (SINGULAIR) 10 MG tablet Take 1 tablet by mouth at bedtime. Patient not taking: Reported on 09/09/2023    [provider]  Multiple Vitamins-Minerals (MULTIVITAMIN WITH MINERALS) tablet Take 1 tablet by mouth daily. Patient not taking: Reported on 09/09/2023    [provider]  OVER THE COUNTER MEDICATION  Hempvana cream as needed for arthritis\    [provider]  oxyCODONE (OXY IR/ROXICODONE) 5 MG immediate release tablet Take 1 tablet every 4-6 hours by oral route as needed for pain for 7 days. Patient not taking: Reported on 09/09/2023 06/30/22   [provider]  predniSONE (DELTASONE) 10 MG tablet Take 6 tablets by mouth on day 1, 5 tablets on day 2, 4 tablets on day 3, 3 tablets on day 4, 2 tablets on day 5 and 1 tablet on day 6. take WITH FOOD Patient not taking: Reported on 09/09/2023    [provider]  valsartan-hydrochlorothiazide (DIOVAN-HCT) 80-12.5 MG tablet Take 0.5 tablets by mouth daily. Patient not taking: Reported on 09/09/2023    [provider]  ZOFRAN  4 MG tablet Take 2 tablets every 8 hours by oral route. 06/30/22   [provider]    Current Outpatient Medications  Medication Sig Dispense Refill   acetaminophen (TYLENOL) 650 MG CR tablet Take 650 mg by mouth every 8 (eight) hours as needed for pain.     cetirizine (ZYRTEC) 10 MG tablet Take 10 mg by mouth daily.     Coenzyme Q10 100 MG TABS Take 100 mg by mouth 2 (two) times daily.     metoprolol tartrate (LOPRESSOR) 50 MG tablet Take 0.5 tablets by mouth 2 (two) times daily.     VALSARTAN PO Take by mouth. Valsartan 12.5mg , 1/2 tablet daily     Vitamin D, Ergocalciferol, (DRISDOL) 1.25 MG (50000 UNIT) CAPS capsule Take 50,000 Units by mouth once a week.     aspirin EC 81 MG tablet Take 81 mg by mouth 2 (two) times a week. Swallow whole. (Patient not taking: Reported on 09/09/2023)     baclofen (LIORESAL) 10 MG tablet Take 10 mg by mouth 2 (two) times daily as needed.     cloNIDine (CATAPRES) 0.1 MG tablet 0.1mg  1/2 tablet daily (Patient not taking: Reported on 09/09/2023)     furosemide (LASIX) 20 MG tablet 20 mg daily as needed.     GLUCOSAMINE-CHONDROITIN PO Take by mouth. (Patient not taking: Reported on 09/09/2023)     meclizine (ANTIVERT) 12.5 MG tablet SMARTSIG:1-2 Tablet(s) By Mouth Every 12 Hours PRN     methocarbamol (ROBAXIN) 500 MG tablet Take 1 tablet every 6-8 hours by oral route as needed for spasm for 10 days. (Patient not taking: Reported on 09/09/2023)     montelukast (SINGULAIR) 10 MG tablet Take 1 tablet by mouth daily. (Patient not taking: Reported on 09/09/2023)     montelukast (SINGULAIR) 10 MG tablet Take 1 tablet by mouth at bedtime. (Patient not taking: Reported on 09/09/2023)     Multiple Vitamins-Minerals (MULTIVITAMIN WITH MINERALS) tablet Take 1 tablet by mouth daily. (Patient not taking: Reported on 09/09/2023)     OVER THE COUNTER MEDICATION Hempvana cream as needed for arthritis\     oxyCODONE (OXY IR/ROXICODONE) 5 MG immediate release  tablet Take 1 tablet every 4-6 hours by oral route as needed for pain for 7 days. (Patient not taking: Reported on 09/09/2023)     predniSONE (DELTASONE) 10 MG tablet Take 6 tablets by mouth on day 1, 5 tablets on day 2, 4 tablets on day 3, 3 tablets on day 4, 2 tablets on day 5 and 1 tablet on day 6. take WITH FOOD (Patient not taking: Reported on 09/09/2023)     valsartan-hydrochlorothiazide (DIOVAN-HCT) 80-12.5 MG tablet Take 0.5 tablets by mouth daily. (Patient not taking: Reported on 09/09/2023)  ZOFRAN  4 MG tablet Take 2 tablets every 8 hours by oral route.     Current Facility-Administered Medications  Medication Dose Route Frequency Provider Last Rate Last Admin   0.9 %  sodium chloride  infusion  500 mL Intravenous Once Charlanne Groom, MD       0.9 %  sodium chloride  infusion  500 mL Intravenous Continuous Charlanne Groom, MD        Allergies as of 09/09/2023 - Review Complete 09/09/2023  Allergen Reaction Noted   Sulfa antibiotics Nausea Only 02/21/2018   Misc. sulfonamide containing compounds Other (See Comments) 07/09/2022    Family History  Problem Relation Age of Onset   Colon polyps Sister    Colon cancer Sister 25   Lung cancer Brother    Liver cancer Brother    Esophageal cancer Neg Hx    Rectal cancer Neg Hx    Stomach cancer Neg Hx     Social History   Socioeconomic History   Marital status: Married    Spouse name: Not on file   Number of children: Not on file   Years of education: Not on file   Highest education level: Not on file  Occupational History   Not on file  Tobacco Use   Smoking status: Former    Current packs/day: 0.00    Types: Cigarettes    Quit date: 09/01/1987    Years since quitting: 36.0   Smokeless tobacco: Never  Vaping Use   Vaping status: Never Used  Substance and Sexual Activity   Alcohol use: Never   Drug use: Never   Sexual activity: Not Currently    Birth control/protection: Post-menopausal, Surgical  Other Topics Concern   Not  on file  Social History Narrative   Not on file   Social Drivers of Health   Financial Resource Strain: Not on file  Food Insecurity: Not on file  Transportation Needs: Not on file  Physical Activity: Not on file  Stress: Not on file  Social Connections: Not on file  Intimate Partner Violence: Not on file    Review of Systems: Positive for  none All other review of systems negative except as mentioned in the HPI.  Physical Exam: Vital signs in last 24 hours: @VSRANGES @   General:   Alert,  Well-developed, well-nourished, pleasant and cooperative in NAD Lungs:  Clear throughout to auscultation.   Heart:  Regular rate and rhythm; no murmurs, clicks, rubs,  or gallops. Abdomen:  Soft, nontender and nondistended. Normal bowel sounds.   Neuro/Psych:  Alert and cooperative. Normal mood and affect. A and O x 3    No significant changes were identified.  The patient continues to be an appropriate candidate for the planned procedure and anesthesia.   Anselm Charlanne, MD. [facility] 09/09/2023 11:08 AM@

## 2023-09-09 NOTE — Patient Instructions (Signed)
Resume previous diet Continue present medications Await pathology results Handouts/ information given for polyps, diverticulosis and hemorrhoids  YOU HAD AN ENDOSCOPIC PROCEDURE TODAY AT THE [facility]:   Refer to the procedure report that was given to you for any specific questions about what was found during the examination.  If the procedure report does not answer your questions, please call your gastroenterologist to clarify.  If you requested that your care partner not be given the details of your procedure findings, then the procedure report has been included in a sealed envelope for you to review at your convenience later.  YOU SHOULD EXPECT: Some feelings of bloating in the abdomen. Passage of more gas than usual.  Walking can help get rid of the air that was put into your GI tract during the procedure and reduce the bloating. If you had a lower endoscopy (such as a colonoscopy or flexible sigmoidoscopy) you may notice spotting of blood in your stool or on the toilet paper. If you underwent a bowel prep for your procedure, you may not have a normal bowel movement for a few days.  Please Note:  You might notice some irritation and congestion in your nose or some drainage.  This is from the oxygen used during your procedure.  There is no need for concern and it should clear up in a day or so.  SYMPTOMS TO REPORT IMMEDIATELY:  Following lower endoscopy (colonoscopy or flexible sigmoidoscopy):  Excessive amounts of blood in the stool  Significant tenderness or worsening of abdominal pains  Swelling of the abdomen that is new, acute  Fever of 100F or higher  For urgent or emergent issues, a gastroenterologist can be reached at any hour by calling [phone]. Do not use MyChart messaging for urgent concerns.    DIET:  We do recommend a small meal at first, but then you may proceed to your regular diet.  Drink plenty of fluids but you should avoid alcoholic beverages for 24  hours.  ACTIVITY:  You should plan to take it easy for the rest of today and you should NOT DRIVE or use heavy machinery until tomorrow (because of the sedation medicines used during the test).    FOLLOW UP: Our staff will call the number listed on your records the next business day following your procedure.  We will call around 7:15- 8:00 am to check on you and address any questions or concerns that you may have regarding the information given to you following your procedure. If we do not reach you, we will leave a message.     If any biopsies were taken you will be contacted by phone or by letter within the next 1-3 weeks.  Please call us at [phone] if you have not heard about the biopsies in 3 weeks.    SIGNATURES/CONFIDENTIALITY: You and/or your care partner have signed paperwork which will be entered into your electronic medical record.  These signatures attest to the fact that that the information above on your After Visit Summary has been reviewed and is understood.  Full responsibility of the confidentiality of this discharge information lies with you and/or your care-partner. 

## 2023-09-10 ENCOUNTER — Telehealth: Payer: Self-pay

## 2023-09-10 NOTE — Telephone Encounter (Signed)
 Post procedure follow up call, no answer

## 2023-09-10 NOTE — Progress Notes (Signed)
 [facility]  [address] [place],  [place]  [address] [phone]  Clinic Day: 09/14/23  Referring physician: [name], MD  CHIEF COMPLAINT:  CC: Stage IA left breast cancer  Current Treatment: Hormonal therapy  HISTORY OF PRESENT ILLNESS:  Meghan Carpenter is a 78 y.o. 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 This was found on screening bilateral mammogram from July 2022, which revealed a possible distortion in the left breast. Unilateral diagnostic left mammogram and ultrasound was pursued and confirmed a suspicious mass with distortion in the upper-outer left breast without a definite sonographic correlate. There was no evidence of left axillary lymphadenopathy. 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 HER2 was negative (1+). Estrogen and progesterone receptors were both positive at 95%. Ki67 was 5%. Meghan Carpenter was referred to Dr. Joesph and underwent left breast lumpectomy and left axillary sentinel lymph node biopsy on September 15th. 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 All margins negative for invasive carcinoma and DCIS. One regional lymph node was negative for malignancy (0/1). Meghan Carpenter was placed on endocrine therapy with anastrozole  in October 2022.  Meghan Carpenter had menarche at age 71 and menopause at age 42.  Meghan Carpenter was on hormone replacement therapy for 3-4 years. Meghan Carpenter had a hysterectomy and bilateral salping oophorectomy in 2000 for fibroids. Meghan Carpenter has had 2 live births, the first at age 55. The radiation oncologist did not feel radiotherapy was necessary. Meghan Carpenter is up to date on colonoscopy. Meghan Carpenter was unable to tolerate Anastrozole  or Letrozole .  INTERVAL HISTORY:  Meghan Carpenter is here for routine follow up for  stage IA left breast cancer. Patient states that Meghan Carpenter feels well and denies any pain. Her last mammogram was in August and was negative.  Meghan Carpenter had colonoscopy last week at [facility] and had removal of a 4 mm polyp from the sigmoid colon that was hyperplastic.  Her last bone density in January of 2023 was normal. Dr. Gable does routine blood work on her every 6 months. Dr. Joesph will see her again in August and I can see her in 1 year for re-examination.  Meghan Carpenter denies fevers, chills, nausea, vomiting, chest pain, dyspnea or cough. Her appetite is good and her weight has increased 4 pounds over last 6 months .  REVIEW OF SYSTEMS:  Review of Systems  Constitutional:  Negative for appetite change, chills, diaphoresis, fatigue, fever and unexpected weight change.  HENT:  Negative.  Negative for hearing loss, lump/mass, mouth sores, nosebleeds, sore throat, tinnitus, trouble swallowing and voice change.   Eyes: Negative.  Negative for eye problems and icterus.  Respiratory: Negative.  Negative for chest tightness, cough, hemoptysis, shortness of breath and wheezing.   Cardiovascular: Negative.  Negative for chest pain, leg swelling and palpitations.  Gastrointestinal: Negative.  Negative for abdominal distention, abdominal pain, blood in stool, constipation, diarrhea, nausea, rectal pain and vomiting.  Endocrine: Negative.   Genitourinary: Negative.  Negative for bladder incontinence, difficulty urinating, dyspareunia, dysuria, frequency, hematuria, menstrual problem, nocturia, pelvic pain, vaginal bleeding and vaginal discharge.   Musculoskeletal:  Positive for arthralgias (right hand s/p surgery) and myalgias. Negative for back pain, flank pain, gait problem, neck pain and neck stiffness.  Skin: Negative.  Negative for itching, rash and wound.  Neurological:  Negative for dizziness, extremity weakness, gait problem, headaches, light-headedness, numbness, seizures and speech difficulty.  Hematological:  Negative.  Negative for adenopathy. Does not bruise/bleed easily.  Psychiatric/Behavioral: Negative.  Negative for confusion, decreased concentration, depression, sleep disturbance and suicidal ideas. The patient is not nervous/anxious.      VITALS:  Blood pressure (!) 146/78, pulse (!) 55, temperature 98 F (36.7 C), temperature source Oral, resp. rate 16, height 5' 8 (1.727 m), weight 240 lb 11.2 oz (109.2 kg), SpO2 98%.  Wt Readings from Last 3 Encounters:  09/14/23 240 lb 11.2 oz (109.2 kg)  09/09/23 236 lb (107 kg)  08/31/23 236 lb (107 kg)    Body mass index is 36.6 kg/m.  Performance status (ECOG): 1 - Symptomatic but completely ambulatory  PHYSICAL EXAM:  Physical Exam Vitals and nursing note reviewed.  Constitutional:      General: Meghan Carpenter is not in acute distress.    Appearance: Normal appearance. Meghan Carpenter is normal weight. Meghan Carpenter is not ill-appearing, toxic-appearing or diaphoretic.  HENT:     Head: Normocephalic and atraumatic.     Right Ear: Tympanic membrane, ear canal and external ear normal. There is no impacted cerumen.     Left Ear: Tympanic membrane, ear canal and external ear normal. There is no impacted cerumen.     Nose: Nose normal. No congestion or rhinorrhea.     Mouth/Throat:     Mouth: Mucous membranes are moist.     Pharynx: Oropharynx is clear. No oropharyngeal exudate or posterior oropharyngeal erythema.  Eyes:     General: No scleral icterus.       Right eye: No discharge.        Left eye: No discharge.     Extraocular Movements: Extraocular movements intact.     Conjunctiva/sclera: Conjunctivae normal.     Pupils: Pupils are equal, round, and reactive to light.  Neck:     Vascular: No carotid bruit.  Cardiovascular:     Rate and Rhythm: Normal rate and regular rhythm.     Pulses: Normal pulses.     Heart sounds: Normal heart sounds. No murmur heard.    No friction rub. No gallop.  Pulmonary:     Effort: Pulmonary effort is normal. No respiratory  distress.     Breath sounds: Normal breath sounds. No stridor. No wheezing, rhonchi or rales.  Chest:     Chest wall: No tenderness.     Comments: Faint scar in the upper outer quadrant of the left breast and another in the left axilla. Both are healing well.  No masses in either breast. Abdominal:     General: Bowel sounds are normal. There is no distension.     Palpations: Abdomen is soft. There is no hepatomegaly, splenomegaly or mass.     Tenderness: There is no abdominal tenderness. There is no right CVA tenderness, left CVA tenderness, guarding or rebound.     Hernia: No hernia is present.  Musculoskeletal:        General: No swelling, tenderness, deformity or signs of injury. Normal range of motion.     Cervical back: Normal range of motion and neck supple. No rigidity or tenderness.     Right lower leg: No edema.     Left lower leg: No edema.  Lymphadenopathy:     Cervical: No cervical adenopathy.  Skin:    General: Skin is warm and dry.     Coloration: Skin is not jaundiced or pale.     Findings: No  bruising, erythema, lesion or rash.  Neurological:     General: No focal deficit present.     Mental Status: Meghan Carpenter is alert and oriented to person, place, and time. Mental status is at baseline.     Cranial Nerves: No cranial nerve deficit.     Sensory: No sensory deficit.     Motor: No weakness.     Coordination: Coordination normal.     Gait: Gait normal.     Deep Tendon Reflexes: Reflexes normal.  Psychiatric:        Mood and Affect: Mood normal.        Behavior: Behavior normal.        Thought Content: Thought content normal.        Judgment: Judgment normal.    LABS:      Latest Ref Rng & Units 06/16/2021   12:00 AM 02/20/2011   12:38 PM  CBC  WBC  11.7       Hemoglobin 12.0 - 16.0 12.7     14.6   Hematocrit 36 - 46 38       Platelets 150 - 399 258          This result is from an external source.      Latest Ref Rng & Units 06/16/2021   12:00 AM  CMP  BUN  4 - 21 14      Creatinine 0.5 - 1.1 0.8      Sodium 137 - 147 139      Potassium 3.4 - 5.3 4.1      Chloride 99 - 108 104      CO2 13 - 22 30      Calcium 8.7 - 10.7 9.3      Alkaline Phos 25 - 125 110      AST 13 - 35 33      ALT 7 - 35 23         This result is from an external source.    STUDIES:     HISTORY:   Allergies:  Allergies  Allergen Reactions   Sulfa Antibiotics Nausea Only   Misc. Sulfonamide Containing Compounds Other (See Comments)    Current Medications: Current Outpatient Medications  Medication Sig Dispense Refill   GLUCOSAMINE-CHONDROITIN PO Take by mouth.     acetaminophen (TYLENOL) 650 MG CR tablet Take 650 mg by mouth every 8 (eight) hours as needed for pain.     baclofen (LIORESAL) 10 MG tablet Take 10 mg by mouth 2 (two) times daily as needed.     cetirizine (ZYRTEC) 10 MG tablet Take 10 mg by mouth daily.     Coenzyme Q10 100 MG TABS Take 100 mg by mouth 2 (two) times daily.     furosemide (LASIX) 20 MG tablet 20 mg daily as needed.     meclizine (ANTIVERT) 12.5 MG tablet SMARTSIG:1-2 Tablet(s) By Mouth Every 12 Hours PRN     metoprolol tartrate (LOPRESSOR) 50 MG tablet Take 0.5 tablets by mouth 2 (two) times daily.     OVER THE COUNTER MEDICATION Hempvana cream as needed for arthritis\     VALSARTAN PO Take by mouth. Valsartan 12.5mg , 1/2 tablet daily     Vitamin D, Ergocalciferol, (DRISDOL) 1.25 MG (50000 UNIT) CAPS capsule Take 50,000 Units by mouth once a week.     No current facility-administered medications for this visit.     ASSESSMENT & PLAN:  Assessment:   Stage IA (T1b N0 M0) invasive ductal carcinoma and  ductal carcinoma in situ of the left breast, diagnosed August 2022 and treated with lumpectomy. Meghan Carpenter will not need radiation or chemotherapy. Meghan Carpenter was unable to tolerate hormonal therapy. Meghan Carpenter had an abnormal mammogram of the left breast August 2023, but pathology report from 04/08/2022 was benign.  Normal bone density. Meghan Carpenter will be  due for repeat examination in January 2025   Plan: Meghan Carpenter did not tolerate Anastrozole  or Letrozole .  Meghan Carpenter wishes to avoid tamoxifen in view of potential for thrombosis.  Her last mammogram was in August and was negative.  Meghan Carpenter had her colonoscopy last week at [facility] and had removal of a 4 mm polyp from the sigmoid colon that was hyperplastic.  Her last bone density in January of 2023 was normal. Her PCP, Dr. Gable, does routine blood work on her every 6 months. Meghan Carpenter informed me that Meghan Carpenter was informed that Meghan Carpenter is prediabetic and is working on that. I will see her back in 1 year for reexamination. Meghan Carpenter understands and agrees with this plan of care. Meghan Carpenter knows Meghan Carpenter can call for any concerns regarding her breast cancer.  I provided 11 minutes of face-to-face time during this this encounter and > 50% was spent counseling as documented under my assessment and plan.    Wanda VEAR Cornish, MD [facility] AT [facility] [address] [place] [address] Dept: [phone] Dept Fax: [phone]   No orders of the defined types were placed in this encounter.

## 2023-09-13 LAB — SURGICAL PATHOLOGY

## 2023-09-14 ENCOUNTER — Inpatient Hospital Stay: Payer: Medicare Other | Attending: Oncology | Admitting: Oncology

## 2023-09-14 ENCOUNTER — Telehealth: Payer: Self-pay | Admitting: Oncology

## 2023-09-14 ENCOUNTER — Encounter: Payer: Self-pay | Admitting: Oncology

## 2023-09-14 VITALS — BP 146/78 | HR 55 | Temp 98.0°F | Resp 16 | Ht 68.0 in | Wt 240.7 lb

## 2023-09-14 DIAGNOSIS — Z9079 Acquired absence of other genital organ(s): Secondary | ICD-10-CM | POA: Insufficient documentation

## 2023-09-14 DIAGNOSIS — Z90722 Acquired absence of ovaries, bilateral: Secondary | ICD-10-CM | POA: Insufficient documentation

## 2023-09-14 DIAGNOSIS — R7303 Prediabetes: Secondary | ICD-10-CM | POA: Insufficient documentation

## 2023-09-14 DIAGNOSIS — C50412 Malignant neoplasm of upper-outer quadrant of left female breast: Secondary | ICD-10-CM | POA: Diagnosis not present

## 2023-09-14 DIAGNOSIS — Z90710 Acquired absence of both cervix and uterus: Secondary | ICD-10-CM | POA: Diagnosis not present

## 2023-09-14 DIAGNOSIS — Z170 Estrogen receptor positive status [ER+]: Secondary | ICD-10-CM | POA: Insufficient documentation

## 2023-09-14 DIAGNOSIS — C50912 Malignant neoplasm of unspecified site of left female breast: Secondary | ICD-10-CM | POA: Diagnosis present

## 2023-09-14 NOTE — Telephone Encounter (Signed)
 09/14/23 Spoke with patient and confirmed next appt

## 2023-09-15 ENCOUNTER — Encounter: Payer: Self-pay | Admitting: Gastroenterology

## 2023-09-30 ENCOUNTER — Encounter (HOSPITAL_BASED_OUTPATIENT_CLINIC_OR_DEPARTMENT_OTHER): Payer: Self-pay

## 2023-09-30 ENCOUNTER — Ambulatory Visit (HOSPITAL_BASED_OUTPATIENT_CLINIC_OR_DEPARTMENT_OTHER)
Admission: EM | Admit: 2023-09-30 | Discharge: 2023-09-30 | Disposition: A | Discharge status: Home / Self Care | Payer: Medicare Other | Attending: Family Medicine | Admitting: Family Medicine

## 2023-09-30 DIAGNOSIS — J3489 Other specified disorders of nose and nasal sinuses: Secondary | ICD-10-CM

## 2023-09-30 DIAGNOSIS — J0140 Acute pansinusitis, unspecified: Secondary | ICD-10-CM

## 2023-09-30 MED ORDER — CEFDINIR 300 MG PO CAPS: 300.0000 mg | ORAL_CAPSULE | Freq: Two times a day (BID) | ORAL | 0 refills | Status: AC

## 2023-09-30 NOTE — ED Provider Notes (Signed)
[facility]    CSN: [id number] Arrival date & time: 09/30/23  1210      History   Chief Complaint Chief Complaint  Patient presents with   Facial Pain    HPI Meghan Carpenter is a 78 y.o. female.   Patient reports she has chronic sinus congestion and allergies all the time.  More recently in the last 4 to 5 days she has had significant facial pressure and pain some discolored nasal discharge and headaches.  Denies fever, body aches, nausea, vomiting, diarrhea, constipation.     Past Medical History:  Diagnosis Date   Allergy    Arthritis    Cancer (HCC) 2022   Left breast cancer one lymph node, no chemo no radiation, lumpectemy   Heart murmur    Hyperlipidemia    Hypertension    Seasonal allergies    Vertigo     Patient Active Problem List   Diagnosis Date Noted   Pain in joint of right knee 12/18/2022   Acquired trigger finger of right middle finger 10/15/2022   Arthritis of carpometacarpal (CMC) joint of right thumb 05/11/2022   Breast cancer of upper-outer quadrant of left female breast (HCC) 04/30/2021    Class: Diagnosis of    Past Surgical History:  Procedure Laterality Date   ABDOMINAL HYSTERECTOMY  2000   ANKLE SURGERY Right 2013   BACK SURGERY  1990   BREAST LUMPECTOMY WITH NEEDLE LOCALIZATION AND AXILLARY SENTINEL LYMPH NODE BX Left 2022   one lymph node   COLONOSCOPY  2019   Lynann Bologna   FOOT SURGERY Right 1997   HAND SURGERY Right 2010   KNEE SURGERY Left 2006   right thumb joint Right 06/30/2022   thumb joint replaced   TOE SURGERY Right 1996   TRIGGER FINGER RELEASE Right 07/2023   middle finger    OB History   No obstetric history on file.      Home Medications    Prior to Admission medications   Medication Sig Start Date End Date Taking? Authorizing Provider  cefdinir (OMNICEF) 300 MG capsule Take 1 capsule (300 mg total) by mouth 2 (two) times daily for 7 days. 09/30/23 10/07/23 Yes Prescilla Sours, FNP  acetaminophen  (TYLENOL) 650 MG CR tablet Take 650 mg by mouth every 8 (eight) hours as needed for pain.    [provider]  baclofen (LIORESAL) 10 MG tablet Take 10 mg by mouth 2 (two) times daily as needed. 12/10/21   [provider]  cetirizine (ZYRTEC) 10 MG tablet Take 10 mg by mouth daily.    [provider]  Coenzyme Q10 100 MG TABS Take 100 mg by mouth 2 (two) times daily.    [provider]  furosemide (LASIX) 20 MG tablet 20 mg daily as needed.    [provider]  GLUCOSAMINE-CHONDROITIN PO Take by mouth.    [provider]  meclizine (ANTIVERT) 12.5 MG tablet SMARTSIG:1-2 Tablet(s) By Mouth Every 12 Hours PRN    [provider]  metoprolol tartrate (LOPRESSOR) 50 MG tablet Take 0.5 tablets by mouth 2 (two) times daily.    [provider]  OVER THE COUNTER MEDICATION Hempvana cream as needed for arthritis\    [provider]  VALSARTAN PO Take by mouth. Valsartan 12.5mg , 1/2 tablet daily    [provider]  Vitamin D, Ergocalciferol, (DRISDOL) 1.25 MG (50000 UNIT) CAPS capsule Take 50,000 Units by mouth once a week. 03/12/21   [provider]  Family History Family History  Problem Relation Age of Onset   Colon polyps Sister    Colon cancer Sister 45   Lung cancer Brother    Liver cancer Brother    Esophageal cancer Neg Hx    Rectal cancer Neg Hx    Stomach cancer Neg Hx     Social History Social History   Tobacco Use   Smoking status: Former    Current packs/day: 0.00    Types: Cigarettes    Quit date: 09/01/1987    Years since quitting: 36.1   Smokeless tobacco: Never  Vaping Use   Vaping status: Never Used  Substance Use Topics   Alcohol use: Never   Drug use: Never     Allergies   Sulfa antibiotics and Misc. sulfonamide containing compounds   Review of Systems Review of Systems  Constitutional:  Negative for chills and fever.  HENT:  Positive for postnasal drip,  rhinorrhea, sinus pressure and sinus pain. Negative for ear pain and sore throat.   Eyes:  Negative for pain and visual disturbance.  Respiratory:  Negative for cough and shortness of breath.   Cardiovascular:  Negative for chest pain and palpitations.  Gastrointestinal:  Negative for abdominal pain, constipation, diarrhea, nausea and vomiting.  Genitourinary:  Negative for dysuria and hematuria.  Musculoskeletal:  Negative for arthralgias and back pain.  Skin:  Negative for color change and rash.  Neurological:  Negative for seizures and syncope.  All other systems reviewed and are negative.    Physical Exam Triage Vital Signs ED Triage Vitals  Encounter Vitals Group     BP 09/30/23 1216 (!) 149/78     Systolic BP Percentile --      Diastolic BP Percentile --      Pulse Rate 09/30/23 1216 (!) 58     Resp 09/30/23 1216 20     Temp 09/30/23 1216 97.8 F (36.6 C)     Temp Source 09/30/23 1216 Oral     SpO2 09/30/23 1216 95 %     Weight --      Height --      Head Circumference --      Peak Flow --      Pain Score 09/30/23 1218 5     Pain Loc --      Pain Education --      Exclude from Growth Chart --    No data found.  Updated Vital Signs BP (!) 149/78 (BP Location: Left Arm)   Pulse (!) 58   Temp 97.8 F (36.6 C) (Oral)   Resp 20   SpO2 95%   Visual Acuity Right Eye Distance:   Left Eye Distance:   Bilateral Distance:    Right Eye Near:   Left Eye Near:    Bilateral Near:     Physical Exam Vitals and nursing note reviewed.  Constitutional:      General: She is not in acute distress.    Appearance: She is well-developed and overweight. She is not ill-appearing or toxic-appearing.  HENT:     Head: Normocephalic and atraumatic.     Right Ear: Hearing, tympanic membrane, ear canal and external ear normal.     Left Ear: Hearing, tympanic membrane, ear canal and external ear normal.     Nose: Mucosal edema, congestion and rhinorrhea present. Rhinorrhea is  purulent.     Right Sinus: Maxillary sinus tenderness and frontal sinus tenderness present.     Left Sinus: Maxillary sinus tenderness and  frontal sinus tenderness present.     Mouth/Throat:     Lips: Pink.     Mouth: Mucous membranes are moist.     Pharynx: Uvula midline. No oropharyngeal exudate or posterior oropharyngeal erythema.     Tonsils: No tonsillar exudate.  Eyes:     Conjunctiva/sclera: Conjunctivae normal.     Pupils: Pupils are equal, round, and reactive to light.  Cardiovascular:     Rate and Rhythm: Normal rate and regular rhythm.     Heart sounds: S1 normal and S2 normal. No murmur heard. Pulmonary:     Effort: Pulmonary effort is normal. No respiratory distress.     Breath sounds: Normal breath sounds. No decreased breath sounds, wheezing, rhonchi or rales.  Abdominal:     Palpations: Abdomen is soft.     Tenderness: There is no abdominal tenderness.  Musculoskeletal:        General: No swelling.     Cervical back: Neck supple.  Lymphadenopathy:     Head:     Right side of head: No submental, submandibular, tonsillar, preauricular or posterior auricular adenopathy.     Left side of head: No submental, submandibular, tonsillar, preauricular or posterior auricular adenopathy.     Cervical: Cervical adenopathy present.     Right cervical: Superficial cervical adenopathy present.     Left cervical: Superficial cervical adenopathy present.  Skin:    General: Skin is warm and dry.     Capillary Refill: Capillary refill takes less than 2 seconds.     Findings: No rash.  Neurological:     Mental Status: She is alert and oriented to person, place, and time.  Psychiatric:        Mood and Affect: Mood normal.      UC Treatments / Results  Labs (all labs ordered are listed, but only abnormal results are displayed) Labs Reviewed - No data to display  EKG   Radiology No results found.  Procedures Procedures (including critical care time)  Medications  Ordered in UC Medications - No data to display  Initial Impression / Assessment and Plan / UC Course  I have reviewed the triage vital signs and the nursing notes.  Pertinent labs & imaging results that were available during my care of the patient were reviewed by me and considered in my medical decision making (see chart for details).  History and exam are consistent with pansinusitis.  Encouraged sinus rinses.  Encouraged fluticasone, 1 spray into each nostril, once daily for nasal congestion.  Cefdinir, 300 mg, twice daily for 7 days.  Get plenty of fluids and rest.  Follow-up if symptoms do not improve, worsen or new symptoms occur. Final Clinical Impressions(s) / UC Diagnoses   Final diagnoses:  Acute non-recurrent pansinusitis  Sinus pain     Discharge Instructions      History and exam are consistent with pansinusitis.  Encouraged sinus rinses.  Cefdinir, 300 mg, twice daily for 7 days.  Encouraged OTC fluticasone, 1 spray into each nostril, once daily for nasal congestion.  Follow-up if symptoms do not improve, worsen or new symptoms occur.     ED Prescriptions     Medication Sig Dispense Auth. Provider   cefdinir (OMNICEF) 300 MG capsule Take 1 capsule (300 mg total) by mouth 2 (two) times daily for 7 days. 14 capsule Prescilla Sours, FNP      PDMP not reviewed this encounter.   Prescilla Sours, FNP 09/30/23 1234

## 2023-09-30 NOTE — ED Triage Notes (Signed)
"  I think I have a sinus infection"   Hx of the same. Symptoms x 4 days.

## 2023-09-30 NOTE — Discharge Instructions (Addendum)
History and exam are consistent with pansinusitis.  Encouraged sinus rinses.  Cefdinir, 300 mg, twice daily for 7 days.  Encouraged OTC fluticasone, 1 spray into each nostril, once daily for nasal congestion.  Follow-up if symptoms do not improve, worsen or new symptoms occur.

## 2023-10-06 DIAGNOSIS — R0982 Postnasal drip: Secondary | ICD-10-CM | POA: Diagnosis not present

## 2023-10-06 DIAGNOSIS — J302 Other seasonal allergic rhinitis: Secondary | ICD-10-CM | POA: Diagnosis not present

## 2023-10-06 DIAGNOSIS — H9203 Otalgia, bilateral: Secondary | ICD-10-CM | POA: Diagnosis not present

## 2023-10-06 DIAGNOSIS — J029 Acute pharyngitis, unspecified: Secondary | ICD-10-CM | POA: Diagnosis not present

## 2023-10-06 DIAGNOSIS — R519 Headache, unspecified: Secondary | ICD-10-CM | POA: Diagnosis not present

## 2023-10-06 DIAGNOSIS — R051 Acute cough: Secondary | ICD-10-CM | POA: Diagnosis not present

## 2023-10-06 DIAGNOSIS — R0981 Nasal congestion: Secondary | ICD-10-CM | POA: Diagnosis not present

## 2023-11-13 DIAGNOSIS — R051 Acute cough: Secondary | ICD-10-CM | POA: Diagnosis not present

## 2023-11-13 DIAGNOSIS — R059 Cough, unspecified: Secondary | ICD-10-CM | POA: Diagnosis not present

## 2023-11-13 DIAGNOSIS — J22 Unspecified acute lower respiratory infection: Secondary | ICD-10-CM | POA: Diagnosis not present

## 2023-11-23 DIAGNOSIS — E1169 Type 2 diabetes mellitus with other specified complication: Secondary | ICD-10-CM | POA: Diagnosis not present

## 2023-11-23 DIAGNOSIS — R42 Dizziness and giddiness: Secondary | ICD-10-CM | POA: Diagnosis not present

## 2023-11-23 DIAGNOSIS — K219 Gastro-esophageal reflux disease without esophagitis: Secondary | ICD-10-CM | POA: Diagnosis not present

## 2023-11-23 DIAGNOSIS — J309 Allergic rhinitis, unspecified: Secondary | ICD-10-CM | POA: Diagnosis not present

## 2023-11-23 DIAGNOSIS — E559 Vitamin D deficiency, unspecified: Secondary | ICD-10-CM | POA: Diagnosis not present

## 2023-11-23 DIAGNOSIS — I10 Essential (primary) hypertension: Secondary | ICD-10-CM | POA: Diagnosis not present

## 2023-11-23 DIAGNOSIS — Z79899 Other long term (current) drug therapy: Secondary | ICD-10-CM | POA: Diagnosis not present

## 2023-11-23 DIAGNOSIS — N3281 Overactive bladder: Secondary | ICD-10-CM | POA: Diagnosis not present

## 2023-11-23 DIAGNOSIS — E785 Hyperlipidemia, unspecified: Secondary | ICD-10-CM | POA: Diagnosis not present

## 2023-11-23 DIAGNOSIS — M1990 Unspecified osteoarthritis, unspecified site: Secondary | ICD-10-CM | POA: Diagnosis not present

## 2023-11-23 DIAGNOSIS — Z789 Other specified health status: Secondary | ICD-10-CM | POA: Diagnosis not present

## 2023-11-23 DIAGNOSIS — M79606 Pain in leg, unspecified: Secondary | ICD-10-CM | POA: Diagnosis not present

## 2023-12-06 DIAGNOSIS — M1711 Unilateral primary osteoarthritis, right knee: Secondary | ICD-10-CM | POA: Diagnosis not present

## 2023-12-06 HISTORY — DX: Unilateral primary osteoarthritis, right knee: M17.11

## 2023-12-07 DIAGNOSIS — L853 Xerosis cutis: Secondary | ICD-10-CM | POA: Diagnosis not present

## 2023-12-07 DIAGNOSIS — L821 Other seborrheic keratosis: Secondary | ICD-10-CM | POA: Diagnosis not present

## 2023-12-07 DIAGNOSIS — L814 Other melanin hyperpigmentation: Secondary | ICD-10-CM | POA: Diagnosis not present

## 2023-12-07 DIAGNOSIS — L538 Other specified erythematous conditions: Secondary | ICD-10-CM | POA: Diagnosis not present

## 2023-12-07 DIAGNOSIS — L820 Inflamed seborrheic keratosis: Secondary | ICD-10-CM | POA: Diagnosis not present

## 2023-12-07 DIAGNOSIS — D225 Melanocytic nevi of trunk: Secondary | ICD-10-CM | POA: Diagnosis not present

## 2023-12-21 DIAGNOSIS — M1711 Unilateral primary osteoarthritis, right knee: Secondary | ICD-10-CM | POA: Diagnosis not present

## 2023-12-21 DIAGNOSIS — M79604 Pain in right leg: Secondary | ICD-10-CM | POA: Diagnosis not present

## 2024-01-02 DIAGNOSIS — I480 Paroxysmal atrial fibrillation: Secondary | ICD-10-CM | POA: Diagnosis not present

## 2024-01-02 DIAGNOSIS — Z882 Allergy status to sulfonamides status: Secondary | ICD-10-CM | POA: Diagnosis not present

## 2024-01-02 DIAGNOSIS — R0603 Acute respiratory distress: Secondary | ICD-10-CM | POA: Diagnosis not present

## 2024-01-02 DIAGNOSIS — E785 Hyperlipidemia, unspecified: Secondary | ICD-10-CM | POA: Diagnosis not present

## 2024-01-02 DIAGNOSIS — Z87891 Personal history of nicotine dependence: Secondary | ICD-10-CM | POA: Diagnosis not present

## 2024-01-02 DIAGNOSIS — Z7901 Long term (current) use of anticoagulants: Secondary | ICD-10-CM | POA: Diagnosis not present

## 2024-01-02 DIAGNOSIS — R718 Other abnormality of red blood cells: Secondary | ICD-10-CM | POA: Diagnosis not present

## 2024-01-02 DIAGNOSIS — R42 Dizziness and giddiness: Secondary | ICD-10-CM | POA: Diagnosis not present

## 2024-01-02 DIAGNOSIS — I10 Essential (primary) hypertension: Secondary | ICD-10-CM | POA: Diagnosis not present

## 2024-01-02 DIAGNOSIS — M47812 Spondylosis without myelopathy or radiculopathy, cervical region: Secondary | ICD-10-CM | POA: Diagnosis not present

## 2024-01-02 DIAGNOSIS — I4891 Unspecified atrial fibrillation: Secondary | ICD-10-CM | POA: Diagnosis not present

## 2024-01-02 DIAGNOSIS — R079 Chest pain, unspecified: Secondary | ICD-10-CM | POA: Diagnosis not present

## 2024-01-02 DIAGNOSIS — E1165 Type 2 diabetes mellitus with hyperglycemia: Secondary | ICD-10-CM | POA: Diagnosis not present

## 2024-01-02 DIAGNOSIS — R404 Transient alteration of awareness: Secondary | ICD-10-CM | POA: Diagnosis not present

## 2024-01-02 DIAGNOSIS — Z888 Allergy status to other drugs, medicaments and biological substances status: Secondary | ICD-10-CM | POA: Diagnosis not present

## 2024-01-03 DIAGNOSIS — I480 Paroxysmal atrial fibrillation: Secondary | ICD-10-CM | POA: Diagnosis not present

## 2024-01-03 DIAGNOSIS — E785 Hyperlipidemia, unspecified: Secondary | ICD-10-CM | POA: Diagnosis not present

## 2024-01-03 DIAGNOSIS — I4891 Unspecified atrial fibrillation: Secondary | ICD-10-CM | POA: Diagnosis not present

## 2024-01-03 DIAGNOSIS — R718 Other abnormality of red blood cells: Secondary | ICD-10-CM | POA: Diagnosis not present

## 2024-01-03 DIAGNOSIS — I361 Nonrheumatic tricuspid (valve) insufficiency: Secondary | ICD-10-CM | POA: Diagnosis not present

## 2024-01-03 DIAGNOSIS — I10 Essential (primary) hypertension: Secondary | ICD-10-CM | POA: Diagnosis not present

## 2024-01-03 DIAGNOSIS — I371 Nonrheumatic pulmonary valve insufficiency: Secondary | ICD-10-CM | POA: Diagnosis not present

## 2024-01-03 DIAGNOSIS — I340 Nonrheumatic mitral (valve) insufficiency: Secondary | ICD-10-CM | POA: Diagnosis not present

## 2024-01-03 DIAGNOSIS — Z7901 Long term (current) use of anticoagulants: Secondary | ICD-10-CM | POA: Diagnosis not present

## 2024-01-03 DIAGNOSIS — I351 Nonrheumatic aortic (valve) insufficiency: Secondary | ICD-10-CM | POA: Diagnosis not present

## 2024-01-10 DIAGNOSIS — R011 Cardiac murmur, unspecified: Secondary | ICD-10-CM | POA: Diagnosis not present

## 2024-01-10 DIAGNOSIS — I4891 Unspecified atrial fibrillation: Secondary | ICD-10-CM | POA: Diagnosis not present

## 2024-01-10 DIAGNOSIS — I10 Essential (primary) hypertension: Secondary | ICD-10-CM | POA: Diagnosis not present

## 2024-01-10 DIAGNOSIS — J302 Other seasonal allergic rhinitis: Secondary | ICD-10-CM | POA: Diagnosis not present

## 2024-01-10 DIAGNOSIS — E785 Hyperlipidemia, unspecified: Secondary | ICD-10-CM | POA: Diagnosis not present

## 2024-01-10 DIAGNOSIS — Z09 Encounter for follow-up examination after completed treatment for conditions other than malignant neoplasm: Secondary | ICD-10-CM | POA: Diagnosis not present

## 2024-01-10 DIAGNOSIS — Z7901 Long term (current) use of anticoagulants: Secondary | ICD-10-CM | POA: Diagnosis not present

## 2024-01-17 ENCOUNTER — Other Ambulatory Visit: Payer: Self-pay

## 2024-01-17 DIAGNOSIS — I10 Essential (primary) hypertension: Secondary | ICD-10-CM | POA: Insufficient documentation

## 2024-01-17 DIAGNOSIS — M1990 Unspecified osteoarthritis, unspecified site: Secondary | ICD-10-CM | POA: Insufficient documentation

## 2024-01-17 DIAGNOSIS — R42 Dizziness and giddiness: Secondary | ICD-10-CM | POA: Insufficient documentation

## 2024-01-17 DIAGNOSIS — E785 Hyperlipidemia, unspecified: Secondary | ICD-10-CM | POA: Insufficient documentation

## 2024-01-17 DIAGNOSIS — T7840XA Allergy, unspecified, initial encounter: Secondary | ICD-10-CM | POA: Insufficient documentation

## 2024-01-17 DIAGNOSIS — J302 Other seasonal allergic rhinitis: Secondary | ICD-10-CM | POA: Insufficient documentation

## 2024-01-17 DIAGNOSIS — R011 Cardiac murmur, unspecified: Secondary | ICD-10-CM | POA: Insufficient documentation

## 2024-01-18 ENCOUNTER — Ambulatory Visit: Attending: Cardiology

## 2024-01-18 ENCOUNTER — Encounter: Payer: Self-pay | Admitting: Cardiology

## 2024-01-18 ENCOUNTER — Ambulatory Visit: Attending: Cardiology | Admitting: Cardiology

## 2024-01-18 VITALS — BP 118/82 | HR 56 | Ht 68.0 in | Wt 240.0 lb

## 2024-01-18 DIAGNOSIS — R011 Cardiac murmur, unspecified: Secondary | ICD-10-CM

## 2024-01-18 DIAGNOSIS — R944 Abnormal results of kidney function studies: Secondary | ICD-10-CM | POA: Diagnosis not present

## 2024-01-18 DIAGNOSIS — I10 Essential (primary) hypertension: Secondary | ICD-10-CM | POA: Diagnosis not present

## 2024-01-18 DIAGNOSIS — E782 Mixed hyperlipidemia: Secondary | ICD-10-CM

## 2024-01-18 DIAGNOSIS — I480 Paroxysmal atrial fibrillation: Secondary | ICD-10-CM

## 2024-01-18 DIAGNOSIS — R7303 Prediabetes: Secondary | ICD-10-CM | POA: Insufficient documentation

## 2024-01-18 HISTORY — DX: Prediabetes: R73.03

## 2024-01-18 HISTORY — DX: Paroxysmal atrial fibrillation: I48.0

## 2024-01-18 MED ORDER — RIVAROXABAN 20 MG PO TABS: 20.0000 mg | ORAL_TABLET | Freq: Every day | ORAL | 0 refills | Status: DC

## 2024-01-18 NOTE — Addendum Note (Signed)
 Addended by: Shawnee Dellen D on: 01/18/2024 09:19 AM   Modules accepted: Orders

## 2024-01-18 NOTE — Progress Notes (Signed)
 Cardiology Office Note:    Date:  01/18/2024   ID:  Meghan Carpenter, DOB [birth year], MRN [id number]  PCP:  Tamela Fake, MD  Cardiologist:  Ralene Burger, MD    Referring MD: Tamela Fake, MD   Chief Complaint  Patient presents with   [facility] follow up    History of Present Illness:    Meghan Carpenter is a 78 y.o. female past medical history significant for breast cancer, dyslipidemia, prediabetes she was referred to us  because she ended up being in a hospital with episode of atrial fibrillation.  Apparently she had upper respiratory tract infection, she was given steroids then she was at church she was sitting started getting very dizzy to the point of passing out however she did not passed out.  She came to hospital she was found to be in atrial fibrillation fast ventricular rate, calcium channel blocker has been given she flew back to sinus rhythm and she is being in sinus rhythm since that time.  Overall she says she is doing fine.  She did not feel atrial fibrillation when she had it.  Evaluation in the hospital showed preserved left ventricle ejection fraction on echocardiogram with normal atrial size, she did have mild mitral valve regurgitation mild aortic regurgitation mild tricuspid mild pulmonary regurgitation.  Overall she is doing well.  She says she is very active denies have any chest pain tightness squeezing pressure burning chest no palpitations otherwise.  She smoked long time ago quit, does have family history of coronary artery disease but not premature.  She never had any heart trouble.  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   Pain in finger of right hand 07/14/2023   Pain in joint of left knee 07/20/2022   Pain in joint of right knee 12/18/2022   Pain of right thumb 05/11/2022   Seasonal allergies    Unilateral primary osteoarthritis, right knee 12/06/2023   Vertigo     Past Surgical History:  Procedure Laterality Date   ABDOMINAL HYSTERECTOMY  2000   ANKLE SURGERY Right 2013   BACK SURGERY  1990   BREAST LUMPECTOMY WITH NEEDLE LOCALIZATION AND AXILLARY SENTINEL LYMPH NODE BX Left 2022   one lymph node   COLONOSCOPY  2019   Lajuan Pila   FOOT SURGERY Right 1997   HAND SURGERY Right 2010   KNEE SURGERY Left 2006   right thumb joint Right 06/30/2022   thumb joint replaced   TOE SURGERY Right 1996   TRIGGER FINGER RELEASE Right 07/2023   middle finger    Current Medications: Current Meds  Medication Sig   cetirizine (ZYRTEC) 10 MG tablet Take 10 mg by mouth daily.   ELIQUIS 5 MG TABS tablet Take 5 mg by mouth 2 (two) times daily.   metoprolol tartrate (LOPRESSOR) 50 MG tablet Take 0.5 tablets by mouth 2 (two) times daily.   nitroGLYCERIN (NITROSTAT) 0.4 MG SL tablet Place 0.4 mg under  the tongue every 5 (five) minutes as needed for chest pain.   valsartan-hydrochlorothiazide (DIOVAN-HCT) 80-12.5 MG tablet Take 0.5 tablets by mouth daily.   Vitamin D, Ergocalciferol, (DRISDOL) 1.25 MG (50000 UNIT) CAPS capsule Take 50,000 Units by mouth once a week.   [DISCONTINUED] acetaminophen (TYLENOL) 650 MG CR tablet Take 650 mg by mouth every 8 (eight) hours as needed for pain.   [DISCONTINUED] baclofen (LIORESAL) 10 MG tablet Take 10 mg by mouth 2 (two) times daily as needed for muscle spasms.   [DISCONTINUED] Coenzyme Q10 100 MG TABS Take 100 mg by mouth 2 (two) times daily.   [DISCONTINUED] furosemide (LASIX) 20 MG tablet Take 20 mg by mouth daily as needed  for fluid.   [DISCONTINUED] GLUCOSAMINE-CHONDROITIN PO Take 1 tablet by mouth daily.   [DISCONTINUED] meclizine (ANTIVERT) 12.5 MG tablet Take 12.5-25 mg by mouth 2 (two) times daily as needed for dizziness or nausea.   [DISCONTINUED] OVER THE COUNTER MEDICATION Apply 1 Application topically daily as needed (arthritis). Hempvana cream   [DISCONTINUED] VALSARTAN PO Take 6.25 mg by mouth daily. Valsartan 12.5mg , 1/2 tablet daily     Allergies:   Sulfa antibiotics, Statins, and Misc. sulfonamide containing compounds   Social History   Socioeconomic History   Marital status: Married    Spouse name: Not on file   Number of children: Not on file   Years of education: Not on file   Highest education level: Not on file  Occupational History   Not on file  Tobacco Use   Smoking status: Former    Current packs/day: 0.00    Types: Cigarettes    Quit date: 09/01/1987    Years since quitting: 36.4   Smokeless tobacco: Never  Vaping Use   Vaping status: Never Used  Substance and Sexual Activity   Alcohol use: Never   Drug use: Never   Sexual activity: Not Currently    Birth control/protection: Post-menopausal, Surgical  Other Topics Concern   Not on file  Social History Narrative   Not on file   Social Drivers of Health   Financial Resource Strain: Not on file  Food Insecurity: Not on file  Transportation Needs: Not on file  Physical Activity: Not on file  Stress: Not on file  Social Connections: Not on file     Family History: The patient's family history includes Colon cancer (age of onset: 80) in her sister; Colon polyps in her sister; Liver cancer in her brother; Lung cancer in her brother. There is no history of Esophageal cancer, Rectal cancer, or Stomach cancer. ROS:   Please see the history of present illness.    All 14 point review of systems negative except as described per history of present illness  EKGs/Labs/Other Studies Reviewed:    EKG  Interpretation Date/Time:  Tuesday Jan 18 2024 08:37:07 EDT Ventricular Rate:  56 PR Interval:  188 QRS Duration:  92 QT Interval:  400 QTC Calculation: 386 R Axis:   5  Text Interpretation: Sinus bradycardia Low voltage QRS Borderline ECG No previous ECGs available Confirmed by Ralene Burger [phone]) on 01/18/2024 8:52:46 AM    Recent Labs: No results found for requested labs within last 365 days.  Recent Lipid Panel No results found for: "CHOL", "TRIG", "HDL", "CHOLHDL", "VLDL", "LDLCALC", "LDLDIRECT"  Physical Exam:    VS:  BP 118/82 (BP Location: Right Arm, Patient Position: Sitting)   Pulse (!) 56   Ht 5\' 8"  (1.727 m)   Wt 240 lb (108.9 kg)  SpO2 93%   BMI 36.49 kg/m     Wt Readings from Last 3 Encounters:  01/18/24 240 lb (108.9 kg)  09/14/23 240 lb 11.2 oz (109.2 kg)  09/09/23 236 lb (107 kg)     GEN:  Well nourished, well developed in no acute distress HEENT: Normal NECK: No JVD; No carotid bruits LYMPHATICS: No lymphadenopathy CARDIAC: RRR, no murmurs, no rubs, no gallops RESPIRATORY:  Clear to auscultation without rales, wheezing or rhonchi  ABDOMEN: Soft, non-tender, non-distended MUSCULOSKELETAL:  No edema; No deformity  SKIN: Warm and dry LOWER EXTREMITIES: no swelling NEUROLOGIC:  Alert and oriented x 3 PSYCHIATRIC:  Normal affect   ASSESSMENT:    1. Hypertension, unspecified type   2. Paroxysmal atrial fibrillation (HCC)   3. Mixed hyperlipidemia   4. Heart murmur   5. Prediabetes    PLAN:    In order of problems listed above:  Paroxysmal atrial fibrillation maintained sinus rhythm left atrium normal size.  She is anticoag with Eliquis but she said that Eliquis gave her headache.  Will switch her from Eliquis to Xarelto 20 mg daily.  I will ask her to do a Zio patch for 2 weeks to see if she have any recurrences of atrial fibrillation.  Good news is she does have an Apple Watch that will have ability to record her EKG and atrial fibrillation  ask her to let me know if she develop atrial fibrillation. Essential hypertension blood pressure well-controlled continue present management. Dyslipidemia I did review K PN which show me LDL 141 HDL 63.  I proposed to do a calcium score to determine how aggressive we want to be with management of her cholesterol. Prediabetes we discussed this issue and strongly recommended to be more active that should help to prevent development of diabetes.   Medication Adjustments/Labs and Tests Ordered: Current medicines are reviewed at length with the patient today.  Concerns regarding medicines are outlined above.  Orders Placed This Encounter  Procedures   EKG 12-Lead   Medication changes: No orders of the defined types were placed in this encounter.   Signed, Manfred Seed, MD, [facility] 01/18/2024 9:09 AM    [facility]

## 2024-01-18 NOTE — Patient Instructions (Addendum)
 Medication Instructions:   STOP: Eliquis   START: Xarelto 20mg  1 tablet daily with largest meal   Lab Work: None Ordered If you have labs (blood work) drawn today and your tests are completely normal, you will receive your results only by: MyChart Message (if you have MyChart) OR A paper copy in the mail If you have any lab test that is abnormal or we need to change your treatment, we will call you to review the results.   Testing/Procedures:  WHY IS MY DOCTOR PRESCRIBING ZIO? The Zio system is proven and trusted by physicians to detect and diagnose irregular heart rhythms -- and has been prescribed to hundreds of thousands of patients.  The FDA has cleared the Zio system to monitor for many different kinds of irregular heart rhythms. In a study, physicians were able to reach a diagnosis 90% of the time with the Zio system1.  You can wear the Zio monitor -- a small, discreet, comfortable patch -- during your normal day-to-day activity, including while you sleep, shower, and exercise, while it records every single heartbeat for analysis.  1Barrett, P., et al. Comparison of 24 Hour Holter Monitoring Versus 14 Day Novel Adhesive Patch Electrocardiographic Monitoring. American Journal of Medicine, 2014.  ZIO VS. HOLTER MONITORING The Zio monitor can be comfortably worn for up to 14 days. Holter monitors can be worn for 24 to 48 hours, limiting the time to record any irregular heart rhythms you may have. Zio is able to capture data for the 51% of patients who have their first symptom-triggered arrhythmia after 48 hours.1  LIVE WITHOUT RESTRICTIONS The Zio ambulatory cardiac monitor is a small, unobtrusive, and water-resistant patch--you might even forget you're wearing it. The Zio monitor records and stores every beat of your heart, whether you're sleeping, working out, or showering.    We will order CT coronary calcium score. It will cost $99.00 and is not covered by insurance.  Please  call to schedule.    [facility] [address] [place], [place] [address] [phone]   Follow-Up: At [facility], you and your health needs are our priority.  As part of our continuing mission to provide you with exceptional heart care, we have created designated Provider Care Teams.  These Care Teams include your primary Cardiologist (physician) and Advanced Practice Providers (APPs -  Physician Assistants and Nurse Practitioners) who all work together to provide you with the care you need, when you need it.  We recommend signing up for the patient portal called "MyChart".  Sign up information is provided on this After Visit Summary.  MyChart is used to connect with patients for Virtual Visits (Telemedicine).  Patients are able to view lab/test results, encounter notes, upcoming appointments, etc.  Non-urgent messages can be sent to your provider as well.   To learn more about what you can do with MyChart, go to [email or web address].    Your next appointment:   3 month(s)  The format for your next appointment:   In Person  Provider:   Ralene Burger, MD    Other Instructions NA

## 2024-01-20 ENCOUNTER — Telehealth: Payer: Self-pay | Admitting: Cardiology

## 2024-01-20 NOTE — Telephone Encounter (Signed)
   Patient Name: Meghan Carpenter  DOB: [birth year] MRN: [id number]  Primary Cardiologist: None  Chart reviewed as part of pre-operative protocol coverage.   Simple dental extractions  or Cleanings are considered low risk procedures per guidelines and generally do not require any specific cardiac clearance. It is also generally accepted that for simple extractions and dental cleanings, there is no need to interrupt blood thinner therapy.  SBE prophylaxis is not required for the patient from a cardiac standpoint.  I will route this recommendation to the requesting party via Epic fax function and remove from pre-op pool.  Please call with questions.  Francene Ing, Retha Cast, NP 01/20/2024, 10:50 AM

## 2024-01-20 NOTE — Telephone Encounter (Signed)
   Pre-operative Risk Assessment    Patient Name: Meghan Carpenter  DOB: Jan 05, 1946 MRN: [id number]     Request for Surgical Clearance    Procedure:  Routine Dental Cleaning  Date of Surgery:  Clearance 01/25/24                                 Surgeon:   Surgeon's Group or Practice Name:  [facility] DDS  Phone number:  [phone]  Fax number:  [phone]   Type of Clearance Requested:   - Pharmacy:  Hold Rivaroxaban (Xarelto) ?   Type of Anesthesia:  None    Additional requests/questions:  none  Signed, April L Harrington   01/20/2024, 10:04 AM

## 2024-02-07 DIAGNOSIS — I4891 Unspecified atrial fibrillation: Secondary | ICD-10-CM | POA: Diagnosis not present

## 2024-02-07 DIAGNOSIS — R059 Cough, unspecified: Secondary | ICD-10-CM | POA: Diagnosis not present

## 2024-02-08 ENCOUNTER — Ambulatory Visit (HOSPITAL_BASED_OUTPATIENT_CLINIC_OR_DEPARTMENT_OTHER)
Admission: RE | Admit: 2024-02-08 | Discharge: 2024-02-08 | Disposition: A | Discharge status: Home / Self Care | Payer: Self-pay | Source: Ambulatory Visit | Attending: Cardiology | Admitting: Cardiology

## 2024-02-08 DIAGNOSIS — E782 Mixed hyperlipidemia: Secondary | ICD-10-CM

## 2024-02-10 ENCOUNTER — Telehealth: Payer: Self-pay | Admitting: Cardiology

## 2024-02-10 DIAGNOSIS — I480 Paroxysmal atrial fibrillation: Secondary | ICD-10-CM | POA: Diagnosis not present

## 2024-02-10 NOTE — Telephone Encounter (Signed)
 [employer] is calling with abnormal results. Call transferred to triage.

## 2024-02-10 NOTE — Telephone Encounter (Signed)
 Spoke with Natalie [facility]) who states that the pt had a 7 second pause on 01/30/24 at 7:59 am. Can be found on page 7 on strip 4.  Monitor is in your studies to read.

## 2024-02-14 ENCOUNTER — Ambulatory Visit: Payer: Self-pay | Admitting: Cardiology

## 2024-02-14 DIAGNOSIS — I480 Paroxysmal atrial fibrillation: Secondary | ICD-10-CM

## 2024-02-16 ENCOUNTER — Telehealth: Payer: Self-pay

## 2024-02-16 NOTE — Telephone Encounter (Signed)
Left message on My Chart with monitor results per Dr. Vanetta Shawl note. Routed to PCP.

## 2024-02-28 ENCOUNTER — Encounter: Payer: Self-pay | Admitting: Cardiology

## 2024-02-29 ENCOUNTER — Telehealth: Payer: Self-pay

## 2024-02-29 DIAGNOSIS — H68001 Unspecified Eustachian salpingitis, right ear: Secondary | ICD-10-CM | POA: Diagnosis not present

## 2024-02-29 DIAGNOSIS — I480 Paroxysmal atrial fibrillation: Secondary | ICD-10-CM | POA: Diagnosis not present

## 2024-02-29 DIAGNOSIS — S30860A Insect bite (nonvenomous) of lower back and pelvis, initial encounter: Secondary | ICD-10-CM | POA: Diagnosis not present

## 2024-02-29 DIAGNOSIS — H6591 Unspecified nonsuppurative otitis media, right ear: Secondary | ICD-10-CM | POA: Diagnosis not present

## 2024-02-29 DIAGNOSIS — W57XXXA Bitten or stung by nonvenomous insect and other nonvenomous arthropods, initial encounter: Secondary | ICD-10-CM | POA: Diagnosis not present

## 2024-02-29 NOTE — Telephone Encounter (Signed)
 Left message on My Chart with Ca Score results per Dr. Vanetta Shawl note. Routed to PCP.

## 2024-03-01 ENCOUNTER — Telehealth: Payer: Self-pay

## 2024-03-01 NOTE — Telephone Encounter (Signed)
 Pt viewed Calcium Score results on My Chart per Dr. Vanetta Shawl note. Routed to PCP.

## 2024-03-14 DIAGNOSIS — M7989 Other specified soft tissue disorders: Secondary | ICD-10-CM | POA: Diagnosis not present

## 2024-03-14 DIAGNOSIS — E739 Lactose intolerance, unspecified: Secondary | ICD-10-CM | POA: Diagnosis not present

## 2024-03-14 DIAGNOSIS — I10 Essential (primary) hypertension: Secondary | ICD-10-CM | POA: Diagnosis not present

## 2024-03-14 DIAGNOSIS — M79606 Pain in leg, unspecified: Secondary | ICD-10-CM | POA: Diagnosis not present

## 2024-03-14 DIAGNOSIS — Z789 Other specified health status: Secondary | ICD-10-CM | POA: Diagnosis not present

## 2024-03-14 DIAGNOSIS — J309 Allergic rhinitis, unspecified: Secondary | ICD-10-CM | POA: Diagnosis not present

## 2024-03-14 DIAGNOSIS — K219 Gastro-esophageal reflux disease without esophagitis: Secondary | ICD-10-CM | POA: Diagnosis not present

## 2024-03-14 DIAGNOSIS — E785 Hyperlipidemia, unspecified: Secondary | ICD-10-CM | POA: Diagnosis not present

## 2024-03-14 DIAGNOSIS — N3281 Overactive bladder: Secondary | ICD-10-CM | POA: Diagnosis not present

## 2024-03-14 DIAGNOSIS — M1990 Unspecified osteoarthritis, unspecified site: Secondary | ICD-10-CM | POA: Diagnosis not present

## 2024-03-14 DIAGNOSIS — E1169 Type 2 diabetes mellitus with other specified complication: Secondary | ICD-10-CM | POA: Diagnosis not present

## 2024-03-30 ENCOUNTER — Encounter: Payer: Self-pay | Admitting: Cardiology

## 2024-03-30 ENCOUNTER — Ambulatory Visit: Attending: Cardiology | Admitting: Cardiology

## 2024-03-30 VITALS — BP 132/68 | HR 71 | Ht 68.0 in | Wt 235.0 lb

## 2024-03-30 DIAGNOSIS — R011 Cardiac murmur, unspecified: Secondary | ICD-10-CM

## 2024-03-30 DIAGNOSIS — I495 Sick sinus syndrome: Secondary | ICD-10-CM | POA: Diagnosis not present

## 2024-03-30 DIAGNOSIS — I480 Paroxysmal atrial fibrillation: Secondary | ICD-10-CM

## 2024-03-30 DIAGNOSIS — D6869 Other thrombophilia: Secondary | ICD-10-CM

## 2024-03-30 DIAGNOSIS — I10 Essential (primary) hypertension: Secondary | ICD-10-CM

## 2024-03-30 NOTE — Patient Instructions (Signed)
 Medication Instructions:  Your physician recommends that you continue on your current medications as directed. Please refer to the Current Medication list given to you today.  *If you need a refill on your cardiac medications before your next appointment, please call your pharmacy*  Lab Work: None ordered  Testing/Procedures: None ordered  Follow-Up: At [facility], you and your health needs are our priority.  As part of our continuing mission to provide you with exceptional heart care, our providers are all part of one team.  This team includes your primary Cardiologist (physician) and Advanced Practice Providers or APPs (Physician Assistants and Nurse Practitioners) who all work together to provide you with the care you need, when you need it.  Your next appointment:   6 month(s)  Provider:   Soyla Norton, MD in [place]   Thank you for choosing [facility]!!   Maeola Domino, RN [phone]

## 2024-03-30 NOTE — Progress Notes (Signed)
 Electrophysiology Office Note:   Date:  03/30/2024  ID:  Meghan Carpenter, DOB 1946/03/06, MRN [id number]  Primary Cardiologist: Lamar Fitch, MD Primary Heart Failure: None Electrophysiologist: Ozan Maclay Gladis Norton, MD      History of Present Illness:   Meghan Carpenter is a 78 y.o. female with h/o atrial ration seen today for  for Electrophysiology evaluation of atrial fibrillation at the request of Lamar Fitch.    She is atrial steroid.  She was at church and started to feel quite dizzy point and nearly passing out.  She came to the hospital be in atrial fibrillation with rapid response.  She was started on a calcium channel blocker and converted to sinus rhythm.  Echo showed a normal ejection fraction.  She was initially started on metoprolol, but cardiac monitor showed significant pauses.  Metoprolol dose was decreased.  Since then she has done well.  She has noted intermittent palpitations but is not overly bothered by them.  She had a low burden of atrial fibrillation on her monitor.  She is happy with her control.  Review of systems complete and found to be negative unless listed in HPI.   EP Information / Studies Reviewed:    EKG is ordered today. Personal review as below.  EKG Interpretation Date/Time:  Thursday March 30 2024 09:39:43 EDT Ventricular Rate:  71 PR Interval:  180 QRS Duration:  82 QT Interval:  376 QTC Calculation: 408 R Axis:   45  Text Interpretation: Normal sinus rhythm Low voltage QRS When compared with ECG of 18-Jan-2024 08:37, No significant change was found Confirmed by Emmely Bittinger ([id number]) on 03/30/2024 9:52:25 AM     Risk Assessment/Calculations:    CHA2DS2-VASc Score = 4   This indicates a 4.8% annual risk of stroke. The patient's score is based upon: CHF History: 0 HTN History: 1 Diabetes History: 0 Stroke History: 0 Vascular Disease History: 0 Age Score: 2 Gender Score: 1            Physical Exam:   VS:  BP 132/68 (BP Location:  Right Arm, Patient Position: Sitting, Cuff Size: Large)   Pulse 71   Ht 5' 8 (1.727 m)   Wt 235 lb (106.6 kg)   SpO2 95%   BMI 35.73 kg/m    Wt Readings from Last 3 Encounters:  03/30/24 235 lb (106.6 kg)  01/18/24 240 lb (108.9 kg)  09/14/23 240 lb 11.2 oz (109.2 kg)     GEN: Well nourished, well developed in no acute distress NECK: No JVD; No carotid bruits CARDIAC: Regular rate and rhythm, no murmurs, rubs, gallops RESPIRATORY:  Clear to auscultation without rales, wheezing or rhonchi  ABDOMEN: Soft, non-tender, non-distended EXTREMITIES:  No edema; No deformity   ASSESSMENT AND PLAN:    1.  Paroxysmal atrial fibrillation: On Xarelto  and metoprolol.  She Christina Gintz monitor that showed a 2% atrial fibrillation burden episodes of sinus arrest.  Her metoprolol dose has been decreased.  She has felt well since then.  Meghan Carpenter continue with current management.  We did discuss ablation versus antiarrhythmics.  For now, she would like to hold off.  If she has further episodes of atrial fibrillation or pauses, she would like to proceed with further therapy.  2.  Secondary hypercoagulable state: On Xarelto   3.  Hypertension: Well-controlled  4.  Sick sinus syndrome: Has had multiple episodes of sinus arrest with symptomatic pauses.  These occurred during the day.  Her beta-blocker has been reduced without further episodes.  Meghan Carpenter continue with current management.  Case discussed with primary cardiology  Follow up with Dr. Inocencio in 6 months  Signed, Meghan Sitts Gladis Inocencio, MD

## 2024-04-14 ENCOUNTER — Other Ambulatory Visit: Payer: Self-pay | Admitting: Cardiology

## 2024-04-14 NOTE — Telephone Encounter (Signed)
 Prescription refill request for Xarelto  received.  Indication:afib Last office visit:7/25 Weight:106.6  kg Age:78 Scr:0.86 CrCl:93.65  ml/min  Prescription refilled

## 2024-04-17 DIAGNOSIS — Z1231 Encounter for screening mammogram for malignant neoplasm of breast: Secondary | ICD-10-CM | POA: Diagnosis not present

## 2024-04-19 ENCOUNTER — Ambulatory Visit: Attending: Cardiology | Admitting: Cardiology

## 2024-04-19 ENCOUNTER — Encounter: Payer: Self-pay | Admitting: Cardiology

## 2024-04-19 VITALS — BP 138/80 | HR 86 | Ht 68.0 in | Wt 238.6 lb

## 2024-04-19 DIAGNOSIS — E782 Mixed hyperlipidemia: Secondary | ICD-10-CM | POA: Diagnosis not present

## 2024-04-19 DIAGNOSIS — I10 Essential (primary) hypertension: Secondary | ICD-10-CM | POA: Diagnosis not present

## 2024-04-19 DIAGNOSIS — I480 Paroxysmal atrial fibrillation: Secondary | ICD-10-CM

## 2024-04-19 NOTE — Patient Instructions (Signed)
 Medication Instructions:  Your physician recommends that you continue on your current medications as directed. Please refer to the Current Medication list given to you today.  *If you need a refill on your cardiac medications before your next appointment, please call your pharmacy*   Lab Work: None Ordered If you have labs (blood work) drawn today and your tests are completely normal, you will receive your results only by: MyChart Message (if you have MyChart) OR A paper copy in the mail If you have any lab test that is abnormal or we need to change your treatment, we will call you to review the results.   Testing/Procedures: None Ordered   Follow-Up: At [facility], you and your health needs are our priority.  As part of our continuing mission to provide you with exceptional heart care, we have created designated Provider Care Teams.  These Care Teams include your primary Cardiologist (physician) and Advanced Practice Providers (APPs -  Physician Assistants and Nurse Practitioners) who all work together to provide you with the care you need, when you need it.  We recommend signing up for the patient portal called MyChart.  Sign up information is provided on this After Visit Summary.  MyChart is used to connect with patients for Virtual Visits (Telemedicine).  Patients are able to view lab/test results, encounter notes, upcoming appointments, etc.  Non-urgent messages can be sent to your provider as well.   To learn more about what you can do with MyChart, go to [email or web address].    Your next appointment:   5 month(s)  The format for your next appointment:   In Person  Provider:   Lamar Fitch, MD    Other Instructions NA

## 2024-04-19 NOTE — Progress Notes (Signed)
 Cardiology Office Note:    Date:  04/19/2024   ID:  Meghan Carpenter, DOB 26-Apr-1946, MRN [id number]  PCP:  Meghan Cambric, MD  Cardiologist:  Meghan Fitch, MD    Referring MD: Meghan Cambric, MD   No chief complaint on file.   History of Present Illness:    Meghan Carpenter is a 78 y.o. female past medical history significant for dyslipidemia, prediabetes she was referred to us  because of episode of atrial fibrillation.  That happened when she got upper respiratory tract infection after that she did wear a monitor which show episode of atrial fibrillation also some pauses.  She was referred to EP team, she had discussion about antiarrhythmic therapy versus ablation.  She prefer conservative approach so for now we do anticoagulation and she seems to be doing well.  Denies have any dizziness or passing out does have some situational heart speeds up somewhat but only up to about 80.  No chest pain tightness squeezing pressure burning chest overall seems to be doing well.  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   Pain in finger of right hand 07/14/2023   Pain in joint of left knee 07/20/2022   Pain in joint of right knee 12/18/2022   Pain of right thumb 05/11/2022   Seasonal allergies    Unilateral primary osteoarthritis, right knee 12/06/2023   Vertigo     Past Surgical History:  Procedure Laterality Date   ABDOMINAL HYSTERECTOMY  2000   ANKLE SURGERY Right 2013   BACK SURGERY  1990    BREAST LUMPECTOMY WITH NEEDLE LOCALIZATION AND AXILLARY SENTINEL LYMPH NODE BX Left 2022   one lymph node   COLONOSCOPY  2019   Lynnie Bring   FOOT SURGERY Right 1997   HAND SURGERY Right 2010   KNEE SURGERY Left 2006   right thumb joint Right 06/30/2022   thumb joint replaced   TOE SURGERY Right 1996   TRIGGER FINGER RELEASE Right 07/2023   middle finger    Current Medications: Current Meds  Medication Sig   cetirizine (ZYRTEC) 10 MG tablet Take 10 mg by mouth daily.   meloxicam (MOBIC) 7.5 MG tablet Take 7.5 mg by mouth 2 (two) times daily.   montelukast (SINGULAIR) 10 MG tablet Take 10 mg by mouth at bedtime.   nitroGLYCERIN (NITROSTAT) 0.4 MG SL tablet Place 0.4 mg under the tongue every 5 (five) minutes as needed for chest pain.   nystatin ointment (MYCOSTATIN) Apply 1 Application topically as needed.   rivaroxaban  (XARELTO ) 20 MG TABS tablet Take 1 tablet by mouth once daily with supper.   valsartan-hydrochlorothiazide (DIOVAN-HCT) 80-12.5 MG tablet Take 0.5 tablets by mouth daily.   Vitamin D, Ergocalciferol, (DRISDOL) 1.25 MG (50000 UNIT) CAPS capsule Take 50,000 Units by mouth once a week.     Allergies:   Sulfa antibiotics, Statins, and Misc. sulfonamide containing compounds   Social History   Socioeconomic History   Marital status:  Married    Spouse name: Not on file   Number of children: Not on file   Years of education: Not on file   Highest education level: Not on file  Occupational History   Not on file  Tobacco Use   Smoking status: Former    Current packs/day: 0.00    Types: Cigarettes    Quit date: 09/01/1987    Years since quitting: 36.6   Smokeless tobacco: Never  Vaping Use   Vaping status: Never Used  Substance and Sexual Activity   Alcohol use: Never   Drug use: Never   Sexual activity: Not Currently    Birth control/protection: Post-menopausal, Surgical  Other Topics Concern   Not on file  Social History Narrative   Not on file   Social  Drivers of Health   Financial Resource Strain: Not on file  Food Insecurity: Not on file  Transportation Needs: Not on file  Physical Activity: Not on file  Stress: Not on file  Social Connections: Not on file     Family History: The patient's family history includes Colon cancer (age of onset: 59) in her sister; Colon polyps in her sister; Liver cancer in her brother; Lung cancer in her brother. There is no history of Esophageal cancer, Rectal cancer, or Stomach cancer. ROS:   Please see the history of present illness.    All 14 point review of systems negative except as described per history of present illness  EKGs/Labs/Other Studies Reviewed:         Recent Labs: No results found for requested labs within last 365 days.  Recent Lipid Panel No results found for: CHOL, TRIG, HDL, CHOLHDL, VLDL, LDLCALC, LDLDIRECT  Physical Exam:    VS:  BP 138/80   Pulse 86   Ht 5' 8 (1.727 m)   Wt 238 lb 9.6 oz (108.2 kg)   SpO2 96%   BMI 36.28 kg/m     Wt Readings from Last 3 Encounters:  04/19/24 238 lb 9.6 oz (108.2 kg)  03/30/24 235 lb (106.6 kg)  01/18/24 240 lb (108.9 kg)     GEN:  Well nourished, well developed in no acute distress HEENT: Normal NECK: No JVD; No carotid bruits LYMPHATICS: No lymphadenopathy CARDIAC: RRR, no murmurs, no rubs, no gallops RESPIRATORY:  Clear to auscultation without rales, wheezing or rhonchi  ABDOMEN: Soft, non-tender, non-distended MUSCULOSKELETAL:  No edema; No deformity  SKIN: Warm and dry LOWER EXTREMITIES: no swelling NEUROLOGIC:  Alert and oriented x 3 PSYCHIATRIC:  Normal affect   ASSESSMENT:    1. Paroxysmal atrial fibrillation (HCC)   2. Primary hypertension   3. Mixed hyperlipidemia    PLAN:    In order of problems listed above:  Paroxysmal atrial fibrillation continue anticoagulation with Xarelto , no AV blocking agent because of history of pauses, she had discussion about ablation, we will continue  monitoring ask her to let me know if she develop any dizziness passing out or palpitations. Essential hypertension blood pressure well-controlled. Dyslipidemia I did review K PN which show me LDL 141 HDL 63 we did calcium score which is 0.  Diet and exercise for now   Medication Adjustments/Labs and Tests Ordered: Current medicines are reviewed at length with the patient today.  Concerns regarding medicines are outlined above.  No orders of the defined types were placed in this encounter.  Medication changes: No orders of the defined types were placed in this encounter.   Signed, Meghan DOROTHA Fitch, MD, FACC 04/19/2024  9:41 AM    [facility]

## 2024-04-25 DIAGNOSIS — M1711 Unilateral primary osteoarthritis, right knee: Secondary | ICD-10-CM | POA: Diagnosis not present

## 2024-04-25 DIAGNOSIS — M7051 Other bursitis of knee, right knee: Secondary | ICD-10-CM | POA: Diagnosis not present

## 2024-04-28 DIAGNOSIS — C50412 Malignant neoplasm of upper-outer quadrant of left female breast: Secondary | ICD-10-CM | POA: Diagnosis not present

## 2024-04-28 DIAGNOSIS — Z170 Estrogen receptor positive status [ER+]: Secondary | ICD-10-CM | POA: Diagnosis not present

## 2024-05-11 DIAGNOSIS — E785 Hyperlipidemia, unspecified: Secondary | ICD-10-CM | POA: Diagnosis not present

## 2024-05-11 DIAGNOSIS — E559 Vitamin D deficiency, unspecified: Secondary | ICD-10-CM | POA: Diagnosis not present

## 2024-05-11 DIAGNOSIS — K219 Gastro-esophageal reflux disease without esophagitis: Secondary | ICD-10-CM | POA: Diagnosis not present

## 2024-05-11 DIAGNOSIS — E1169 Type 2 diabetes mellitus with other specified complication: Secondary | ICD-10-CM | POA: Diagnosis not present

## 2024-05-11 DIAGNOSIS — M1990 Unspecified osteoarthritis, unspecified site: Secondary | ICD-10-CM | POA: Diagnosis not present

## 2024-05-11 DIAGNOSIS — I10 Essential (primary) hypertension: Secondary | ICD-10-CM | POA: Diagnosis not present

## 2024-05-11 DIAGNOSIS — N289 Disorder of kidney and ureter, unspecified: Secondary | ICD-10-CM | POA: Diagnosis not present

## 2024-05-11 DIAGNOSIS — E739 Lactose intolerance, unspecified: Secondary | ICD-10-CM | POA: Diagnosis not present

## 2024-05-11 DIAGNOSIS — N3281 Overactive bladder: Secondary | ICD-10-CM | POA: Diagnosis not present

## 2024-05-17 DIAGNOSIS — M7051 Other bursitis of knee, right knee: Secondary | ICD-10-CM | POA: Diagnosis not present

## 2024-05-17 DIAGNOSIS — G8929 Other chronic pain: Secondary | ICD-10-CM | POA: Diagnosis not present

## 2024-05-17 DIAGNOSIS — R29898 Other symptoms and signs involving the musculoskeletal system: Secondary | ICD-10-CM | POA: Diagnosis not present

## 2024-05-17 DIAGNOSIS — M25561 Pain in right knee: Secondary | ICD-10-CM | POA: Diagnosis not present

## 2024-05-19 DIAGNOSIS — M25561 Pain in right knee: Secondary | ICD-10-CM | POA: Diagnosis not present

## 2024-05-19 DIAGNOSIS — R29898 Other symptoms and signs involving the musculoskeletal system: Secondary | ICD-10-CM | POA: Diagnosis not present

## 2024-05-19 DIAGNOSIS — M7051 Other bursitis of knee, right knee: Secondary | ICD-10-CM | POA: Diagnosis not present

## 2024-05-19 DIAGNOSIS — G8929 Other chronic pain: Secondary | ICD-10-CM | POA: Diagnosis not present

## 2024-05-22 DIAGNOSIS — J00 Acute nasopharyngitis [common cold]: Secondary | ICD-10-CM | POA: Diagnosis not present

## 2024-05-22 DIAGNOSIS — R051 Acute cough: Secondary | ICD-10-CM | POA: Diagnosis not present

## 2024-05-22 DIAGNOSIS — J3489 Other specified disorders of nose and nasal sinuses: Secondary | ICD-10-CM | POA: Diagnosis not present

## 2024-05-22 DIAGNOSIS — E559 Vitamin D deficiency, unspecified: Secondary | ICD-10-CM | POA: Diagnosis not present

## 2024-05-26 DIAGNOSIS — M25561 Pain in right knee: Secondary | ICD-10-CM | POA: Diagnosis not present

## 2024-05-26 DIAGNOSIS — R29898 Other symptoms and signs involving the musculoskeletal system: Secondary | ICD-10-CM | POA: Diagnosis not present

## 2024-05-26 DIAGNOSIS — G8929 Other chronic pain: Secondary | ICD-10-CM | POA: Diagnosis not present

## 2024-05-26 DIAGNOSIS — M7051 Other bursitis of knee, right knee: Secondary | ICD-10-CM | POA: Diagnosis not present

## 2024-05-31 DIAGNOSIS — R29898 Other symptoms and signs involving the musculoskeletal system: Secondary | ICD-10-CM | POA: Diagnosis not present

## 2024-05-31 DIAGNOSIS — M25561 Pain in right knee: Secondary | ICD-10-CM | POA: Diagnosis not present

## 2024-05-31 DIAGNOSIS — M7051 Other bursitis of knee, right knee: Secondary | ICD-10-CM | POA: Diagnosis not present

## 2024-05-31 DIAGNOSIS — G8929 Other chronic pain: Secondary | ICD-10-CM | POA: Diagnosis not present

## 2024-06-02 DIAGNOSIS — G8929 Other chronic pain: Secondary | ICD-10-CM | POA: Diagnosis not present

## 2024-06-02 DIAGNOSIS — R29898 Other symptoms and signs involving the musculoskeletal system: Secondary | ICD-10-CM | POA: Diagnosis not present

## 2024-06-02 DIAGNOSIS — M25561 Pain in right knee: Secondary | ICD-10-CM | POA: Diagnosis not present

## 2024-06-02 DIAGNOSIS — M7051 Other bursitis of knee, right knee: Secondary | ICD-10-CM | POA: Diagnosis not present

## 2024-06-06 DIAGNOSIS — M25561 Pain in right knee: Secondary | ICD-10-CM | POA: Diagnosis not present

## 2024-06-06 DIAGNOSIS — G8929 Other chronic pain: Secondary | ICD-10-CM | POA: Diagnosis not present

## 2024-06-06 DIAGNOSIS — R29898 Other symptoms and signs involving the musculoskeletal system: Secondary | ICD-10-CM | POA: Diagnosis not present

## 2024-06-06 DIAGNOSIS — M7051 Other bursitis of knee, right knee: Secondary | ICD-10-CM | POA: Diagnosis not present

## 2024-06-08 DIAGNOSIS — E1169 Type 2 diabetes mellitus with other specified complication: Secondary | ICD-10-CM | POA: Diagnosis not present

## 2024-06-08 DIAGNOSIS — K219 Gastro-esophageal reflux disease without esophagitis: Secondary | ICD-10-CM | POA: Diagnosis not present

## 2024-06-08 DIAGNOSIS — M1990 Unspecified osteoarthritis, unspecified site: Secondary | ICD-10-CM | POA: Diagnosis not present

## 2024-06-08 DIAGNOSIS — N3281 Overactive bladder: Secondary | ICD-10-CM | POA: Diagnosis not present

## 2024-06-08 DIAGNOSIS — I10 Essential (primary) hypertension: Secondary | ICD-10-CM | POA: Diagnosis not present

## 2024-06-08 DIAGNOSIS — J309 Allergic rhinitis, unspecified: Secondary | ICD-10-CM | POA: Diagnosis not present

## 2024-06-08 DIAGNOSIS — E739 Lactose intolerance, unspecified: Secondary | ICD-10-CM | POA: Diagnosis not present

## 2024-06-08 DIAGNOSIS — E785 Hyperlipidemia, unspecified: Secondary | ICD-10-CM | POA: Diagnosis not present

## 2024-06-09 DIAGNOSIS — M7051 Other bursitis of knee, right knee: Secondary | ICD-10-CM | POA: Diagnosis not present

## 2024-06-09 DIAGNOSIS — R29898 Other symptoms and signs involving the musculoskeletal system: Secondary | ICD-10-CM | POA: Diagnosis not present

## 2024-06-09 DIAGNOSIS — M25561 Pain in right knee: Secondary | ICD-10-CM | POA: Diagnosis not present

## 2024-06-09 DIAGNOSIS — G8929 Other chronic pain: Secondary | ICD-10-CM | POA: Diagnosis not present

## 2024-06-21 DIAGNOSIS — G8929 Other chronic pain: Secondary | ICD-10-CM | POA: Diagnosis not present

## 2024-06-21 DIAGNOSIS — M7051 Other bursitis of knee, right knee: Secondary | ICD-10-CM | POA: Diagnosis not present

## 2024-06-21 DIAGNOSIS — M25561 Pain in right knee: Secondary | ICD-10-CM | POA: Diagnosis not present

## 2024-06-21 DIAGNOSIS — R29898 Other symptoms and signs involving the musculoskeletal system: Secondary | ICD-10-CM | POA: Diagnosis not present

## 2024-08-18 ENCOUNTER — Other Ambulatory Visit (HOSPITAL_BASED_OUTPATIENT_CLINIC_OR_DEPARTMENT_OTHER): Payer: Self-pay

## 2024-08-18 ENCOUNTER — Ambulatory Visit (HOSPITAL_BASED_OUTPATIENT_CLINIC_OR_DEPARTMENT_OTHER)
Admission: EM | Admit: 2024-08-18 | Discharge: 2024-08-18 | Disposition: A | Discharge status: Home / Self Care | Attending: Family Medicine | Admitting: Family Medicine

## 2024-08-18 ENCOUNTER — Encounter (HOSPITAL_BASED_OUTPATIENT_CLINIC_OR_DEPARTMENT_OTHER): Payer: Self-pay

## 2024-08-18 DIAGNOSIS — J0100 Acute maxillary sinusitis, unspecified: Secondary | ICD-10-CM

## 2024-08-18 MED ORDER — AMOXICILLIN 875 MG PO TABS
875.0000 mg | ORAL_TABLET | Freq: Two times a day (BID) | ORAL | 0 refills | Status: AC
  Filled 2024-08-18: qty 14, 7d supply, fill #0

## 2024-08-18 NOTE — Discharge Instructions (Signed)
 Treating you for a sinus infection.  Take the antibiotics as prescribed.  Can continue over-the-counter medications for your symptoms as needed.  Follow-up as needed

## 2024-08-18 NOTE — ED Provider Notes (Signed)
 " [facility]    CSN: [id number] Arrival date & time: 08/18/24  1050      History   Chief Complaint Chief Complaint  Patient presents with   Cough   Sinus drainage    HPI Meghan Carpenter is a 78 y.o. female.   Mild cough due to sinus drainage, since 4 days. Patient states blood tinged sputum and thick nasal drainage. Denies fever. Reports on Xarelto  for chronic afib. Patient states she feels well except for sinus pressure. Declining flu testing. Thinks possible sinus infection.    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   Pain in finger of right hand 07/14/2023   Pain in joint of left knee 07/20/2022   Pain in joint of right knee 12/18/2022   Pain of right thumb 05/11/2022   Seasonal allergies    Unilateral primary osteoarthritis, right knee 12/06/2023   Vertigo     Patient Active Problem List   Diagnosis Date Noted   Paroxysmal atrial fibrillation (HCC) 01/18/2024   Prediabetes 01/18/2024   Allergy    Arthritis    Heart murmur    Hyperlipidemia    Hypertension    Seasonal allergies    Vertigo    Unilateral primary osteoarthritis, right knee 12/06/2023   Pain in finger of right hand 07/14/2023   Pain in joint of right knee 12/18/2022   Acquired trigger finger of right middle finger 10/15/2022   Inflammatory pain 10/15/2022   Lumbar spondylosis 09/11/2022   Low back pain 08/28/2022   Pain in joint of left knee 07/20/2022   Osteoarthrosis of hand  07/15/2022   Arthritis of carpometacarpal [facility]) joint of right thumb 05/11/2022   Pain of right thumb 05/11/2022   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      Home Medications    Prior to Admission medications  Medication Sig Start Date End Date Taking? Authorizing Provider  amoxicillin (AMOXIL) 875 MG tablet Take 1 tablet (875 mg total) by mouth 2 (two) times daily for 7 days. 08/18/24 08/25/24 Yes Lizza Huffaker A, FNP  cetirizine (ZYRTEC) 10 MG tablet Take 10 mg by mouth daily.    [provider]  meloxicam (MOBIC) 7.5 MG tablet Take 7.5 mg by mouth 2 (two) times daily. 03/14/24   [provider]  montelukast (SINGULAIR) 10 MG tablet Take 10 mg by mouth at bedtime. 02/02/24   [provider]  nitroGLYCERIN (NITROSTAT) 0.4 MG SL tablet Place 0.4 mg under the tongue every 5 (five) minutes as needed for chest pain. 01/03/24   [provider]  nystatin ointment (MYCOSTATIN) Apply 1 Application topically as needed. 03/20/24   [provider]  rivaroxaban  (XARELTO ) 20 MG TABS tablet Take 1 tablet by mouth once daily with supper. 04/14/24   Krasowski, Robert J, MD  valsartan-hydrochlorothiazide (DIOVAN-HCT) 80-12.5 MG tablet Take 0.5 tablets by mouth daily. 12/27/23   [provider]  Vitamin D, Ergocalciferol, (DRISDOL) 1.25  MG (50000 UNIT) CAPS capsule Take 50,000 Units by mouth once a week. 03/12/21   [provider]    Family History Family History  Problem Relation Age of Onset   Colon polyps Sister    Colon cancer Sister 38   Lung cancer Brother    Liver cancer Brother    Esophageal cancer Neg Hx    Rectal cancer Neg Hx    Stomach cancer Neg Hx     Social History Social History[1]   Allergies   Sulfa antibiotics, Statins, and Misc. sulfonamide containing compounds   Review of Systems Review of Systems  Respiratory:  Positive for cough.      Physical Exam Triage Vital Signs ED Triage Vitals  Encounter Vitals Group     BP 08/18/24 1119 125/74     Girls Systolic BP Percentile --      Girls Diastolic BP Percentile --      Boys Systolic BP Percentile --      Boys Diastolic BP Percentile --      Pulse Rate 08/18/24 1119 (!) 104     Resp 08/18/24 1119 20     Temp 08/18/24 1119 97.8 F (36.6 C)     Temp Source 08/18/24 1119 Oral     SpO2 08/18/24 1119 97 %     Weight --      Height --      Head Circumference --      Peak Flow --      Pain Score 08/18/24 1121 3     Pain Loc --      Pain Education --      Exclude from Growth Chart --    No data found.  Updated Vital Signs BP 125/74 (BP Location: Right Arm)   Pulse (!) 104   Temp 97.8 F (36.6 C) (Oral)   Resp 20   SpO2 97%   Visual Acuity Right Eye Distance:   Left Eye Distance:   Bilateral Distance:    Right Eye Near:   Left Eye Near:    Bilateral Near:     Physical Exam Constitutional:      General: She is not in acute distress.    Appearance: Normal appearance. She is not ill-appearing, toxic-appearing or diaphoretic.  HENT:     Head: Normocephalic and atraumatic.     Right Ear: Tympanic membrane and ear canal normal.     Left Ear: Tympanic membrane and ear canal normal.     Nose: Congestion present.     Mouth/Throat:     Pharynx: Oropharynx is clear.  Eyes:     Conjunctiva/sclera: Conjunctivae  normal.  Cardiovascular:     Rate and Rhythm: Normal rate. Rhythm irregular.  Pulses: Normal pulses.     Heart sounds: Normal heart sounds.  Pulmonary:     Effort: Pulmonary effort is normal.     Breath sounds: Normal breath sounds.  Skin:    General: Skin is warm and dry.  Neurological:     Mental Status: She is alert.  Psychiatric:        Mood and Affect: Mood normal.      UC Treatments / Results  Labs (all labs ordered are listed, but only abnormal results are displayed) Labs Reviewed - No data to display  EKG   Radiology No results found.  Procedures Procedures (including critical care time)  Medications Ordered in UC Medications - No data to display  Initial Impression / Assessment and Plan / UC Course  I have reviewed the triage vital signs and the nursing notes.  Pertinent labs & imaging results that were available during my care of the patient were reviewed by me and considered in my medical decision making (see chart for details).     Acute sinusitis-treating for sinus infection.  Antibiotics as prescribed.  Continue over-the-counter medicines for symptoms as needed.  Follow-up as needed Final Clinical Impressions(s) / UC Diagnoses   Final diagnoses:  Acute non-recurrent maxillary sinusitis     Discharge Instructions      Treating you for a sinus infection.  Take the antibiotics as prescribed.  Can continue over-the-counter medications for your symptoms as needed.  Follow-up as needed     ED Prescriptions     Medication Sig Dispense Auth. Provider   amoxicillin (AMOXIL) 875 MG tablet Take 1 tablet (875 mg total) by mouth 2 (two) times daily for 7 days. 14 tablet Adah Wilbert LABOR, FNP      PDMP not reviewed this encounter.     [1]  Social History Tobacco Use   Smoking status: Former    Current packs/day: 0.00    Types: Cigarettes    Quit date: 09/01/1987    Years since quitting: 36.9   Smokeless tobacco: Never  Vaping Use   Vaping  status: Never Used  Substance Use Topics   Alcohol use: Never   Drug use: Never     Adah Wilbert LABOR, FNP 08/18/24 1256  "

## 2024-08-18 NOTE — ED Triage Notes (Signed)
 Mild cough due to sinus drainage, since 4 days. Patient states blood tinged sputum and nasal drainage. Denies fever. Reports on Xarelto  for chronic afib. Patient states she feels well except for sinus pressure. Declining flu testing. Thinks possible sinus infection.

## 2024-09-04 ENCOUNTER — Encounter (HOSPITAL_BASED_OUTPATIENT_CLINIC_OR_DEPARTMENT_OTHER): Payer: Self-pay

## 2024-09-04 ENCOUNTER — Ambulatory Visit (HOSPITAL_BASED_OUTPATIENT_CLINIC_OR_DEPARTMENT_OTHER)
Admission: EM | Admit: 2024-09-04 | Discharge: 2024-09-04 | Disposition: A | Discharge status: Home / Self Care | Attending: Nurse Practitioner | Admitting: Nurse Practitioner

## 2024-09-04 ENCOUNTER — Other Ambulatory Visit (HOSPITAL_BASED_OUTPATIENT_CLINIC_OR_DEPARTMENT_OTHER): Payer: Self-pay

## 2024-09-04 DIAGNOSIS — J0100 Acute maxillary sinusitis, unspecified: Secondary | ICD-10-CM | POA: Diagnosis not present

## 2024-09-04 MED ORDER — DEXAMETHASONE SOD PHOSPHATE PF 10 MG/ML IJ SOLN
10.0000 mg | Freq: Once | INTRAMUSCULAR | Status: AC
  Administered 2024-09-04: 10 mg via INTRAMUSCULAR

## 2024-09-04 MED ORDER — AMOXICILLIN 875 MG PO TABS
875.0000 mg | ORAL_TABLET | ORAL | 0 refills | Status: AC
  Filled 2024-09-04: qty 14, 7d supply, fill #0

## 2024-09-04 MED ORDER — BENZONATATE 200 MG PO CAPS
200.0000 mg | ORAL_CAPSULE | Freq: Three times a day (TID) | ORAL | 0 refills | Status: AC | PRN
  Filled 2024-09-04: qty 30, 10d supply, fill #0

## 2024-09-04 MED ORDER — CETIRIZINE-PSEUDOEPHEDRINE ER 5-120 MG PO TB12
1.0000 | ORAL_TABLET | Freq: Every day | ORAL | 0 refills | Status: AC
  Filled 2024-09-04: qty 12, 12d supply, fill #0

## 2024-09-04 MED ORDER — FLUTICASONE PROPIONATE 50 MCG/ACT NA SUSP
2.0000 | Freq: Every day | NASAL | 0 refills | Status: AC
  Filled 2024-09-04: qty 16, 30d supply, fill #0

## 2024-09-04 NOTE — Discharge Instructions (Addendum)
 You have sinus infection. A sinus infection, also called sinusitis, is inflammation of your sinuses. Take medications as prescribed. Make sure you finish all the prescribed medications even if you start to feel better. You may also take tylenol  and/or ibuprofen  as needed for pain and/or fever. Drink enough fluid to keep your urine pale yellow. Staying hydrated will also help to thin your mucus. Use a cool mist humidifier to keep the humidity level in your home above 50%. Inhale steam for 10-15 minutes, 3-4 times a day. You can do this in the bathroom while a hot shower is running and/or purchase over-the-counter vapor shower tablets which is great to help with nasal congestion.Try to limit your exposure to cool or dry air. Sleep with your head raised to decrease post-nasal drainage. Make sure you get enough sleep each night .Once you have finished your medication and your symptoms have improved, remember to replace your toothbrush to help prevent re-infection. If your symptoms do not improved after completing medications, please follow-up with your healthcare provider.

## 2024-09-04 NOTE — ED Provider Notes (Signed)
 " Meghan Carpenter    CSN: [id number] Arrival date & time: 09/04/24  0851      History   Chief Complaint Chief Complaint  Patient presents with   Cough    HPI Meghan Carpenter is a 79 y.o. female.   Discussed the use of AI scribe software for clinical note transcription with the patient, who gave verbal consent to proceed.   Ms. Wickes presents with a cough that started on Friday and has been ongoing for approximately 3 days. The cough has progressed to the point where it is making her throat red and sore from coughing. She describes having nasal congestion with a small amount of green discharge when she blows her nose. She does not cough anything up and denies sneezing. She reports some sinus pain and tenderness, though notes she has sinus problems and allergies year-round. She took a home COVID test which was negative. She denies fevers, chills, body aches, sore throat, drainage down the back of her throat, shortness of breath, wheezing, nausea, vomiting, diarrhea, or headaches. She has not been around anyone sick to her knowledge. She has been taking Mucinex and Tylenol for symptom management.  The following sections of the patient's history were reviewed and updated as appropriate: allergies, current medications, past family history, past medical history, past social history, past surgical history, and problem list.     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   Pain in finger of right  hand 07/14/2023   Pain in joint of left knee 07/20/2022   Pain in joint of right knee 12/18/2022   Pain of right thumb 05/11/2022   Seasonal allergies    Unilateral primary osteoarthritis, right knee 12/06/2023   Vertigo     Patient Active Problem List   Diagnosis Date Noted   Paroxysmal atrial fibrillation (HCC) 01/18/2024   Prediabetes 01/18/2024   Allergy    Arthritis    Heart murmur    Hyperlipidemia    Hypertension    Seasonal allergies    Vertigo    Unilateral primary osteoarthritis, right knee 12/06/2023   Pain in finger of right hand 07/14/2023   Pain in joint of right knee 12/18/2022   Acquired trigger finger of right middle finger 10/15/2022   Inflammatory pain 10/15/2022   Lumbar spondylosis 09/11/2022   Low back pain 08/28/2022   Pain in joint of left knee 07/20/2022   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   Arthritis of carpometacarpal [facility] joint of right thumb 05/11/2022   Pain of right thumb 05/11/2022   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      Home Medications    Prior to Admission medications  Medication Sig Start Date End Date Taking? Authorizing Provider  amoxicillin  (AMOXIL ) 875 MG tablet Take 1 tablet (875 mg total) by mouth 2 (two) times daily at 8 am and 6 pm for 7 days. 09/04/24 09/11/24 Yes Iola Lukes, FNP   benzonatate  (TESSALON ) 200 MG capsule Take 1 capsule (200 mg total) by mouth 3 (three) times daily as needed for cough. 09/04/24  Yes Adira Limburg, FNP  cetirizine -pseudoephedrine  (ZYRTEC -D) 5-120 MG tablet Take 1 tablet by mouth daily with breakfast. 09/04/24  Yes Iola Lukes, FNP  fluticasone  (FLONASE ) 50 MCG/ACT nasal spray Place 2 sprays into both nostrils daily. Shake well before use. Gently blow nose before spraying. Do not blow nose immediately after use. You should not taste the medication or feel it going down your throat; if you do, adjust your technique. 09/04/24  Yes Iola Lukes, FNP  meloxicam (MOBIC) 7.5 MG tablet Take 7.5 mg by mouth 2 (two) times daily. 03/14/24   [provider]  montelukast (SINGULAIR) 10 MG tablet Take 10 mg by mouth at bedtime. 02/02/24   [provider]  nitroGLYCERIN (NITROSTAT) 0.4 MG SL tablet Place 0.4 mg under the tongue every 5 (five) minutes as needed for chest pain. 01/03/24   [provider]  nystatin ointment (MYCOSTATIN) Apply 1 Application topically as needed. 03/20/24   [provider]  rivaroxaban  (XARELTO ) 20 MG TABS tablet Take 1 tablet by mouth once daily with supper. 04/14/24   Krasowski, Robert J, MD  valsartan-hydrochlorothiazide (DIOVAN-HCT) 80-12.5 MG tablet Take 0.5 tablets by mouth daily. 12/27/23   [provider]  Vitamin D, Ergocalciferol, (DRISDOL) 1.25 MG (50000 UNIT) CAPS capsule Take 50,000 Units by mouth once a week. 03/12/21   [provider]    Family History Family History  Problem Relation Age of Onset   Colon polyps Sister    Colon cancer Sister 38   Lung cancer Brother    Liver cancer Brother    Esophageal cancer Neg Hx    Rectal cancer Neg Hx    Stomach cancer Neg Hx     Social History Social History[1]   Allergies   Sulfa antibiotics, Statins, and Misc. sulfonamide containing compounds   Review of Systems Review of Systems  Constitutional:  Negative  for chills and fever.  HENT:  Positive for congestion, rhinorrhea, sinus pressure, sinus pain and voice change (hoarse). Negative for postnasal drip, sneezing and sore throat.   Respiratory:  Positive for cough (nonproductive). Negative for shortness of breath and wheezing.   Gastrointestinal:  Negative for diarrhea, nausea and vomiting.  Musculoskeletal:  Negative for myalgias.  Neurological:  Negative for headaches.  All other systems reviewed and are negative.    Physical Exam Triage Vital Signs ED Triage Vitals  Encounter Vitals Group     BP 09/04/24 0913 (!) 157/79     Girls Systolic BP Percentile --      Girls Diastolic BP Percentile --      Boys Systolic BP Percentile --  Boys Diastolic BP Percentile --      Pulse Rate 09/04/24 0913 100     Resp 09/04/24 0913 20     Temp 09/04/24 0913 98.3 F (36.8 C)     Temp Source 09/04/24 0913 Oral     SpO2 09/04/24 0913 97 %     Weight --      Height --      Head Circumference --      Peak Flow --      Pain Score 09/04/24 0912 5     Pain Loc --      Pain Education --      Exclude from Growth Chart --    No data found.  Updated Vital Signs BP (!) 157/79 (BP Location: Right Arm)   Pulse 100   Temp 98.3 F (36.8 C) (Oral)   Resp 20   SpO2 97%   Visual Acuity Right Eye Distance:   Left Eye Distance:   Bilateral Distance:    Right Eye Near:   Left Eye Near:    Bilateral Near:     Physical Exam Vitals reviewed.  Constitutional:      General: She is awake. She is not in acute distress.    Appearance: Normal appearance. She is well-developed. She is not ill-appearing, toxic-appearing or diaphoretic.  HENT:     Head: Normocephalic.     Right Ear: Hearing, tympanic membrane, ear canal and external ear normal. No drainage, swelling or tenderness. No middle ear effusion. Tympanic membrane is not erythematous.     Left Ear: Hearing, tympanic membrane, ear canal and external ear normal. No drainage, swelling or  tenderness.  No middle ear effusion. Tympanic membrane is not erythematous.     Nose: Congestion present.     Right Sinus: Maxillary sinus tenderness present. No frontal sinus tenderness.     Left Sinus: Maxillary sinus tenderness present. No frontal sinus tenderness.     Mouth/Throat:     Lips: Pink.     Mouth: Mucous membranes are moist.     Pharynx: Oropharynx is clear. Uvula midline. No pharyngeal swelling, oropharyngeal exudate, posterior oropharyngeal erythema or uvula swelling.     Tonsils: No tonsillar exudate or tonsillar abscesses.  Eyes:     General: Vision grossly intact.     Conjunctiva/sclera: Conjunctivae normal.  Cardiovascular:     Rate and Rhythm: Normal rate and regular rhythm.     Heart sounds: Normal heart sounds.  Pulmonary:     Effort: Pulmonary effort is normal.     Breath sounds: Normal breath sounds and air entry.     Comments: Respirations even and unlabored  Musculoskeletal:        General: Normal range of motion.     Cervical back: Full passive range of motion without pain, normal range of motion and neck supple.  Lymphadenopathy:     Cervical: No cervical adenopathy.  Skin:    General: Skin is warm and dry.  Neurological:     General: No focal deficit present.     Mental Status: She is alert and oriented to person, place, and time.  Psychiatric:        Mood and Affect: Mood normal.        Behavior: Behavior normal. Behavior is cooperative.      UC Treatments / Results  Labs (all labs ordered are listed, but only abnormal results are displayed) Labs Reviewed - No data to display  EKG   Radiology No results found.  Procedures Procedures (including critical Carpenter time)  Medications Ordered in UC Medications  dexamethasone  (DECADRON ) injection 10 mg (10 mg Intramuscular Given 09/04/24 1043)    Initial Impression / Assessment and Plan / UC Course  I have reviewed the triage vital signs and the nursing notes.  Pertinent labs & imaging  results that were available during my Carpenter of the patient were reviewed by me and considered in my medical decision making (see chart for details).     The patient presents with symptoms consistent with acute sinusitis. Treatment plan includes prescribed medications and supportive Carpenter measures such as hydration, rest, and symptomatic relief. The importance of adherence to prescribed therapy was emphasized. Patient was advised to follow up with their primary Carpenter provider as needed and to seek emergency Carpenter for worsening or concerning symptoms.  Today's evaluation has revealed no signs of a dangerous process. Discussed diagnosis with patient and/or guardian. Patient and/or guardian aware of their diagnosis, possible red flag symptoms to watch out for and need for close follow up. Patient and/or guardian understands verbal and written discharge instructions. Patient and/or guardian comfortable with plan and disposition.  Patient and/or guardian has a clear mental status at this time, good insight into illness (after discussion and teaching) and has clear judgment to make decisions regarding their Carpenter  Documentation was completed with the aid of voice recognition software. Transcription may contain typographical errors.   Final Clinical Impressions(s) / UC Diagnoses   Final diagnoses:  Acute non-recurrent maxillary sinusitis     Discharge Instructions      You have sinus infection. A sinus infection, also called sinusitis, is inflammation of your sinuses.Take medications as prescribed. Make sure you finish all the prescribed medications even if you start to feel better. You may also take tylenol and/or ibuprofen as needed for pain and/or fever. Drink enough fluid to keep your urine pale yellow. Staying hydrated will also help to thin your mucus. Use a cool mist humidifier to keep the humidity level in your home above 50%. Inhale steam for 10-15 minutes, 3-4 times a day. You can do this in the  bathroom while a hot shower is running and/or purchase over-the-counter vapor shower tablets which is great to help with nasal congestion.Try to limit your exposure to cool or dry air. Sleep with your head raised to decrease post-nasal drainage. Make sure you get enough sleep each night .Once you have finished your medication and your symptoms have improved, remember to replace your toothbrush to help prevent re-infection. If your symptoms do not improved after completing medications, please follow-up with your healthcare provider.      ED Prescriptions     Medication Sig Dispense Auth. Provider   amoxicillin  (AMOXIL ) 875 MG tablet Take 1 tablet (875 mg total) by mouth 2 (two) times daily at 8 am and 6 pm for 7 days. 14 tablet Iola Lukes, FNP   benzonatate  (TESSALON ) 200 MG capsule Take 1 capsule (200 mg total) by mouth 3 (three) times daily as needed for cough. 30 capsule Iola Lukes, FNP   cetirizine -pseudoephedrine  (ZYRTEC -D) 5-120 MG tablet Take 1 tablet by mouth daily with breakfast. 12 tablet Iola Lukes, FNP   fluticasone  (FLONASE ) 50 MCG/ACT nasal spray Place 2 sprays into both nostrils daily. Shake well before use. Gently blow nose before spraying. Do not blow nose immediately after use. You should not taste the medication or feel it going down your throat; if you do, adjust your technique. 16 g Iola Lukes, FNP  PDMP not reviewed this encounter.     [1]  Social History Tobacco Use   Smoking status: Former    Current packs/day: 0.00    Types: Cigarettes    Quit date: 09/01/1987    Years since quitting: 37.0   Smokeless tobacco: Never  Vaping Use   Vaping status: Never Used  Substance Use Topics   Alcohol use: Never   Drug use: Never     Iola Lukes, FNP 09/04/24 1114  "

## 2024-09-04 NOTE — ED Triage Notes (Signed)
 Pt c/o cough, nasal congestion, hoarseness, occ ear pain, and facial pain since Friday. She has taken mucinex and tylenol with no relief.

## 2024-09-13 ENCOUNTER — Ambulatory Visit: Payer: Medicare Other | Admitting: Oncology

## 2024-09-21 ENCOUNTER — Inpatient Hospital Stay: Attending: Oncology | Admitting: Oncology

## 2024-09-21 ENCOUNTER — Encounter: Payer: Self-pay | Admitting: Cardiology

## 2024-09-21 ENCOUNTER — Ambulatory Visit: Payer: Self-pay | Attending: Cardiology | Admitting: Cardiology

## 2024-09-21 ENCOUNTER — Telehealth: Payer: Self-pay | Admitting: Oncology

## 2024-09-21 ENCOUNTER — Encounter: Payer: Self-pay | Admitting: Oncology

## 2024-09-21 VITALS — BP 130/64 | HR 90 | Ht 68.0 in | Wt 232.1 lb

## 2024-09-21 VITALS — BP 142/70 | HR 96 | Temp 97.7°F | Resp 18 | Ht 68.0 in | Wt 233.4 lb

## 2024-09-21 DIAGNOSIS — Z9079 Acquired absence of other genital organ(s): Secondary | ICD-10-CM | POA: Insufficient documentation

## 2024-09-21 DIAGNOSIS — I10 Essential (primary) hypertension: Secondary | ICD-10-CM | POA: Diagnosis not present

## 2024-09-21 DIAGNOSIS — Z90722 Acquired absence of ovaries, bilateral: Secondary | ICD-10-CM | POA: Diagnosis not present

## 2024-09-21 DIAGNOSIS — I480 Paroxysmal atrial fibrillation: Secondary | ICD-10-CM | POA: Diagnosis not present

## 2024-09-21 DIAGNOSIS — E782 Mixed hyperlipidemia: Secondary | ICD-10-CM

## 2024-09-21 DIAGNOSIS — Z90710 Acquired absence of both cervix and uterus: Secondary | ICD-10-CM | POA: Diagnosis not present

## 2024-09-21 DIAGNOSIS — C50412 Malignant neoplasm of upper-outer quadrant of left female breast: Secondary | ICD-10-CM | POA: Diagnosis not present

## 2024-09-21 DIAGNOSIS — R7303 Prediabetes: Secondary | ICD-10-CM | POA: Diagnosis not present

## 2024-09-21 DIAGNOSIS — Z170 Estrogen receptor positive status [ER+]: Secondary | ICD-10-CM | POA: Diagnosis not present

## 2024-09-21 NOTE — Telephone Encounter (Signed)
 Patient has been scheduled for follow-up visit per 09/20/2024 LOS.  Pt given an appt calendar with date and time.

## 2024-09-21 NOTE — Progress Notes (Signed)
 " [facility]  [address] [place],  [place]  [address] [phone]  Clinic Day: 09/21/2024  Referring physician: [name], MD  CHIEF COMPLAINT:  CC: Stage IA left breast cancer  Current Treatment: Hormonal therapy  HISTORY OF PRESENT ILLNESS:  Meghan Carpenter is a 79 y.o. 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 This was found on screening bilateral mammogram from July 2022, which revealed a possible distortion in the left breast. Unilateral diagnostic left mammogram and ultrasound was pursued and confirmed a suspicious mass with distortion in the upper-outer left breast without a definite sonographic correlate. There was no evidence of left axillary lymphadenopathy. 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 HER2 was negative (1+). Estrogen and progesterone receptors were both positive at 95%. Ki67 was 5%. She was referred to Dr. Joesph and underwent left breast lumpectomy and left axillary sentinel lymph node biopsy on September 15th. 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 All margins negative for invasive carcinoma and DCIS. One regional lymph node was negative for malignancy (0/1). She was placed on endocrine therapy with anastrozole  in October 2022.  She had menarche at age 41 and menopause at age 38.  She was on hormone replacement therapy for 3-4 years. She had a hysterectomy and bilateral salping oophorectomy in 2000 for fibroids. She has had 2 live births, the first at age 76. The radiation oncologist did not feel radiotherapy was necessary. She is up to date on colonoscopy. She was unable to tolerate Anastrozole  or Letrozole .  INTERVAL HISTORY:  Meghan Carpenter is here for routine follow up for stage IA left breast  cancer. Patient states that she feels well and has no complaints of pain. Her bilateral screening mammogram on 04/17/2024 was negative. Her bone density scan on 05/15/2024 was normal with a T-score of 0.1 for the left femoral neck, 1.1 for the left total hip, 0.1 for the right femoral neck, 0.8 for the right total hip, and 1.0 for the left 1/3 radius. She is currently taking 20 mg Xarelto  daily for her paroxysmal atrial fibrillation which was diagnosed after an ED visit on 01/02/2024. She is no longer in atrial fibrillation. She saw Dr. Bernie yesterday and will see Dr. Inocencio in February.  She will see Dr. Joesph in 6 months for follow-up and mammography. I will see her back in 1 year for re-examination and Dr. Gable will complete her routine labs.  She denies fever, chills, night sweats, or other signs of infection. She denies respiratory and gastrointestinal issues. She  denies pain. Her appetite is good and Her weight has decreased 7 pounds over last 1 year.  REVIEW OF SYSTEMS:  Review of Systems  Constitutional:  Negative for appetite change, chills, diaphoresis, fatigue, fever and unexpected weight change.  HENT:  Negative.  Negative for hearing loss, lump/mass, mouth sores, nosebleeds, sore throat, tinnitus, trouble swallowing and voice change.   Eyes: Negative.  Negative for eye problems and icterus.  Respiratory: Negative.  Negative for chest tightness, cough, hemoptysis, shortness of breath and wheezing.   Cardiovascular: Negative.  Negative for chest pain, leg swelling and palpitations.  Gastrointestinal: Negative.  Negative for abdominal distention, abdominal pain, blood in stool, constipation, diarrhea, nausea, rectal pain and vomiting.  Endocrine: Negative.   Genitourinary:  Negative.  Negative for bladder incontinence, difficulty urinating, dyspareunia, dysuria, frequency, hematuria, menstrual problem, nocturia, pelvic pain, vaginal bleeding and vaginal discharge.   Musculoskeletal:   Positive for arthralgias and myalgias. Negative for back pain, flank pain, gait problem, neck pain and neck stiffness.  Skin: Negative.  Negative for itching, rash and wound.  Neurological:  Negative for dizziness, extremity weakness, gait problem, headaches, light-headedness, numbness, seizures and speech difficulty.  Hematological: Negative.  Negative for adenopathy. Does not bruise/bleed easily.  Psychiatric/Behavioral: Negative.  Negative for confusion, decreased concentration, depression, sleep disturbance and suicidal ideas. The patient is not nervous/anxious.     VITALS:  Blood pressure (!) 142/70, pulse 96, temperature 97.7 F (36.5 C), temperature source Oral, resp. rate 18, height 5' 8 (1.727 m), weight 233 lb 6.4 oz (105.9 kg), SpO2 100%.  Wt Readings from Last 3 Encounters:  09/21/24 233 lb 6.4 oz (105.9 kg)  09/21/24 232 lb 2 oz (105.3 kg)  04/19/24 238 lb 9.6 oz (108.2 kg)    Body mass index is 35.49 kg/m.  Performance status (ECOG): 0 - Asymptomatic  PHYSICAL EXAM:  Physical Exam Vitals and nursing note reviewed.  Constitutional:      General: She is not in acute distress.    Appearance: Normal appearance. She is normal weight. She is not ill-appearing, toxic-appearing or diaphoretic.  HENT:     Head: Normocephalic and atraumatic.     Right Ear: Tympanic membrane, ear canal and external ear normal. There is no impacted cerumen.     Left Ear: Tympanic membrane, ear canal and external ear normal. There is no impacted cerumen.     Nose: Nose normal. No congestion or rhinorrhea.     Mouth/Throat:     Mouth: Mucous membranes are moist.     Pharynx: Oropharynx is clear. No oropharyngeal exudate or posterior oropharyngeal erythema.  Eyes:     General: No scleral icterus.       Right eye: No discharge.        Left eye: No discharge.     Extraocular Movements: Extraocular movements intact.     Conjunctiva/sclera: Conjunctivae normal.     Pupils: Pupils are equal, round,  and reactive to light.  Neck:     Vascular: No carotid bruit.  Cardiovascular:     Rate and Rhythm: Normal rate and regular rhythm.     Pulses: Normal pulses.     Heart sounds: Normal heart sounds. No murmur heard.    No friction rub. No gallop.  Pulmonary:     Effort: Pulmonary effort is normal. No respiratory distress.     Breath sounds: Normal breath sounds. No stridor. No wheezing, rhonchi or rales.  Chest:     Chest wall: No tenderness.     Comments: Well-healed scar in the upper outer quadrant of the left breast and another in the left axilla. Both are healing well.  No masses in either breast. Abdominal:     General: Bowel sounds are normal. There is no distension.     Palpations: Abdomen is soft. There is no hepatomegaly, splenomegaly or mass.     Tenderness: There is no abdominal tenderness. There is no right CVA tenderness, left CVA tenderness, guarding or rebound.     Hernia: No hernia is present.  Musculoskeletal:        General: No swelling, tenderness, deformity or signs of injury. Normal range of motion.     Cervical back: Normal range of motion and neck supple. No rigidity or tenderness.  Right lower leg: No edema.     Left lower leg: No edema.  Lymphadenopathy:     Cervical: No cervical adenopathy.  Skin:    General: Skin is warm and dry.     Coloration: Skin is not jaundiced or pale.     Findings: No bruising, erythema, lesion or rash.  Neurological:     General: No focal deficit present.     Mental Status: She is alert and oriented to person, place, and time. Mental status is at baseline.     Cranial Nerves: No cranial nerve deficit.     Sensory: No sensory deficit.     Motor: No weakness.     Coordination: Coordination normal.     Gait: Gait normal.     Deep Tendon Reflexes: Reflexes normal.  Psychiatric:        Mood and Affect: Mood normal.        Behavior: Behavior normal.        Thought Content: Thought content normal.        Judgment: Judgment  normal.    LABS:      Latest Ref Rng & Units 06/16/2021   12:00 AM 02/20/2011   12:38 PM  CBC  WBC  11.7       Hemoglobin 12.0 - 16.0 12.7     14.6   Hematocrit 36 - 46 38       Platelets 150 - 399 258          This result is from an external source.      Latest Ref Rng & Units 06/16/2021   12:00 AM  CMP  BUN 4 - 21 14      Creatinine 0.5 - 1.1 0.8      Sodium 137 - 147 139      Potassium 3.4 - 5.3 4.1      Chloride 99 - 108 104      CO2 13 - 22 30      Calcium 8.7 - 10.7 9.3      Alkaline Phos 25 - 125 110      AST 13 - 35 33      ALT 7 - 35 23         This result is from an external source.    STUDIES:  EXAM: 05/15/2024 DG DEXA FINDINGS: Lumbar spine: Not performed due to degenerative changes.  T-scores: Left femoral neck: 0.1 Left total hip: 1.1 Right femoral neck: 0.1 Right total hip: 0.8 Left 1/3 radius: 1.0 IMPRESSION: Normal      HISTORY:   Allergies:  Allergies  Allergen Reactions   Sulfa Antibiotics Nausea Only   Statins Other (See Comments)    Muscle pain   Misc. Sulfonamide Containing Compounds Other (See Comments)    Current Medications: Current Outpatient Medications  Medication Sig Dispense Refill   benzonatate  (TESSALON ) 200 MG capsule Take 1 capsule (200 mg total) by mouth 3 (three) times daily as needed for cough. 30 capsule 0   cetirizine -pseudoephedrine  (ZYRTEC -D) 5-120 MG tablet Take 1 tablet by mouth daily with breakfast. 12 tablet 0   fluticasone  (FLONASE ) 50 MCG/ACT nasal spray Place 2 sprays into both nostrils daily. Shake well before use. Gently blow nose before spraying. Do not blow nose immediately after use. You should not taste the medication or feel it going down your throat; if you do, adjust your technique. 16 g 0   meloxicam (MOBIC) 7.5 MG tablet Take 7.5 mg by mouth 2 (two) times  daily.     montelukast (SINGULAIR) 10 MG tablet Take 10 mg by mouth at bedtime.     nitroGLYCERIN (NITROSTAT) 0.4 MG SL tablet Place  0.4 mg under the tongue every 5 (five) minutes as needed for chest pain.     rivaroxaban  (XARELTO ) 20 MG TABS tablet Take 1 tablet by mouth once daily with supper. 90 tablet 1   valsartan-hydrochlorothiazide (DIOVAN-HCT) 80-12.5 MG tablet Take 0.5 tablets by mouth daily.     Vitamin D, Ergocalciferol, (DRISDOL) 1.25 MG (50000 UNIT) CAPS capsule Take 50,000 Units by mouth once a week.     traMADol (ULTRAM) 50 MG tablet Take 50 mg by mouth every 8 (eight) hours as needed.     No current facility-administered medications for this visit.     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 She did not need radiation or chemotherapy. She was unable to tolerate hormonal therapy. She had an abnormal mammogram of the left breast August 2023, but pathology report from 04/08/2022 was benign.  Normal bone density in September 2025.  Plan: Her bilateral screening mammogram on 04/17/2024 was negative. Her bone density scan on 05/15/2024 was normal with a T-score of 0.1 for the left femoral neck, 1.1 for the left total hip, 0.1 for the right femoral neck, 0.8 for the right total hip, and 1.0 for the left 1/3 radius. She is currently taking 20 mg Xarelto  daily for her paroxysmal atrial fibrillation which was diagnosed after an ED visit on 01/02/2024. She is no longer in atrial fibrillation. She saw Dr. Bernie yesterday and will see Dr. Inocencio in February.  She will see Dr. Joesph in 6 months for follow-up and mammography. I will see her back in 1 year for re-examination and Dr. Gable will complete her routine labs. She understands and agrees with this plan of care. She knows she can call for any concerns regarding her breast cancer.  I provided 13 minutes of face-to-face time during this this encounter and > 50% was spent counseling as documented under my assessment and plan.    Wanda VEAR Cornish, MD  [facility] DEPT OF [facility] [address] [place] [address] Dept: [phone] Dept Fax: [phone]   No orders of the defined types were placed in this encounter.  LILLETTE Aretta Cook, acting as a scribe for Wanda VEAR Cornish, MD, have documented all relevant documentation on the behalf of Wanda VEAR Cornish, MD, as directed by Wanda VEAR Cornish, MD, while in the presence of Wanda VEAR Cornish, MD.  I have reviewed this report as typed by the medical scribe, and it is complete and accurate.  "

## 2024-09-21 NOTE — Progress Notes (Signed)
 " Cardiology Office Note:    Date:  09/21/2024   ID:  Meghan Carpenter, DOB 04-17-1946, MRN [id number]  PCP:  Meghan Cambric, MD  Cardiologist:  Lamar Fitch, MD    Referring MD: Meghan Cambric, MD   No chief complaint on file. Doing fine  History of Present Illness:     Meghan Carpenter is a 79 y.o. female past medical history significant for paroxysmal atrial fibrillation she did have episode of atrial fibrillation with upper respiratory tract infection, since that time seems to be doing well, she did have discussion about potential ablation versus antiarrhythmic therapy she prefers just conservative approach and overall she seems to be doing well she described to have rare episode of short lasting palpitations otherwise doing fine.  Additional problem include essential hypertension, dyslipidemia, calcium score 0.7 with approach to diet.   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   Pain in finger of right hand 07/14/2023   Pain in joint of left knee 07/20/2022   Pain in joint of right knee 12/18/2022   Pain of right thumb 05/11/2022   Paroxysmal atrial fibrillation (HCC) 01/18/2024   Prediabetes 01/18/2024   Seasonal allergies    Unilateral primary osteoarthritis, right knee 12/06/2023   Vertigo     Past Surgical History:  Procedure Laterality Date   ABDOMINAL HYSTERECTOMY  2000   ANKLE SURGERY Right 2013   BACK SURGERY  1990   BREAST LUMPECTOMY WITH NEEDLE LOCALIZATION AND AXILLARY  SENTINEL LYMPH NODE BX Left 2022   one lymph node   COLONOSCOPY  2019   Lynnie Bring   FOOT SURGERY Right 1997   HAND SURGERY Right 2010   KNEE SURGERY Left 2006   right thumb joint Right 06/30/2022   thumb joint replaced   TOE SURGERY Right 1996   TRIGGER FINGER RELEASE Right 07/2023   middle finger    Current Medications: Active Medications[1]   Allergies:   Sulfa antibiotics, Statins, and Misc. sulfonamide containing compounds   Social History   Socioeconomic History   Marital status: Married    Spouse name: Not on file   Number of children: Not on file   Years of education: Not on file   Highest education level: Not on file  Occupational History   Not on file  Tobacco Use   Smoking status: Former    Current packs/day: 0.00    Types: Cigarettes    Quit date: 09/01/1987    Years since quitting: 37.0   Smokeless tobacco: Never  Vaping Use   Vaping status: Never Used  Substance and Sexual Activity   Alcohol use: Never   Drug use: Never   Sexual activity: Not Currently    Birth control/protection: Post-menopausal, Surgical  Other Topics Concern   Not on file  Social History Narrative   Not on file   Social Drivers of Health   Tobacco Use: Medium Risk (09/21/2024)   Patient History  Smoking Tobacco Use: Former    Smokeless Tobacco Use: Never    Passive Exposure: Not on Actuary Strain: Not on file  Food Insecurity: Not on file  Transportation Needs: Not on file  Physical Activity: Not on file  Stress: Not on file  Social Connections: Not on file  Depression (EYV7-0): Not on file  Alcohol Screen: Not on file  Housing: Not on file  Utilities: Not on file  Health Literacy: Not on file     Family History: The patient's family history includes Colon cancer (age of onset: 37) in her sister; Colon polyps in her sister; Liver cancer in her brother; Lung cancer in her brother. There is no history of Esophageal cancer, Rectal cancer, or Stomach  cancer. ROS:   Please see the history of present illness.    All 14 point review of systems negative except as described per history of present illness  EKGs/Labs/Other Studies Reviewed:         Recent Labs: No results found for requested labs within last 365 days.  Recent Lipid Panel No results found for: CHOL, TRIG, HDL, CHOLHDL, VLDL, LDLCALC, LDLDIRECT  Physical Exam:    VS:  BP 130/64   Pulse 90   Ht 5' 8 (1.727 m)   Wt 232 lb 2 oz (105.3 kg)   SpO2 96%   BMI 35.29 kg/m     Wt Readings from Last 3 Encounters:  09/21/24 232 lb 2 oz (105.3 kg)  04/19/24 238 lb 9.6 oz (108.2 kg)  03/30/24 235 lb (106.6 kg)     GEN:  Well nourished, well developed in no acute distress HEENT: Normal NECK: No JVD; No carotid bruits LYMPHATICS: No lymphadenopathy CARDIAC: RRR, no murmurs, no rubs, no gallops RESPIRATORY:  Clear to auscultation without rales, wheezing or rhonchi  ABDOMEN: Soft, non-tender, non-distended MUSCULOSKELETAL:  No edema; No deformity  SKIN: Warm and dry LOWER EXTREMITIES: no swelling NEUROLOGIC:  Alert and oriented x 3 PSYCHIATRIC:  Normal affect   ASSESSMENT:    1. Paroxysmal atrial fibrillation (HCC)   2. Primary hypertension   3. Mixed hyperlipidemia   4. Prediabetes    PLAN:    In order of problems listed above:  Paroxysmal atrial fibrillation maintained anticoagulation doing well from that point review told her to set up her Apple Watch so she will be able to record her arrhythmia she has not and let me know if she got recurrences of atrial fibrillation to reconsider either antiarrhythmic therapy or ablation. Essential hypertension blood pressure well-controlled continue present management. Dyslipidemia calcium score 0 continue exercise and diet   Medication Adjustments/Labs and Tests Ordered: Current medicines are reviewed at length with the patient today.  Concerns regarding medicines are outlined above.  No orders of the  defined types were placed in this encounter.  Medication changes: No orders of the defined types were placed in this encounter.   Signed, Lamar DOROTHA Fitch, MD, [facility] 09/21/2024 11:11 AM    [facility]    [1]  Current Meds  Medication Sig   benzonatate  (TESSALON ) 200 MG capsule Take 1 capsule (200 mg total) by mouth 3 (three) times daily as needed for cough.   cetirizine -pseudoephedrine  (ZYRTEC -D) 5-120 MG tablet Take 1 tablet by mouth daily with breakfast.   fluticasone  (FLONASE ) 50 MCG/ACT nasal spray Place 2 sprays into both nostrils daily. Shake well before use. Gently blow nose before spraying. Do not blow nose immediately after use. You should not taste  the medication or feel it going down your throat; if you do, adjust your technique.   meloxicam (MOBIC) 7.5 MG tablet Take 7.5 mg by mouth 2 (two) times daily.   montelukast (SINGULAIR) 10 MG tablet Take 10 mg by mouth at bedtime.   nitroGLYCERIN (NITROSTAT) 0.4 MG SL tablet Place 0.4 mg under the tongue every 5 (five) minutes as needed for chest pain.   rivaroxaban  (XARELTO ) 20 MG TABS tablet Take 1 tablet by mouth once daily with supper.   traMADol (ULTRAM) 50 MG tablet Take 50 mg by mouth every 8 (eight) hours as needed.   valsartan-hydrochlorothiazide (DIOVAN-HCT) 80-12.5 MG tablet Take 0.5 tablets by mouth daily.   Vitamin D, Ergocalciferol, (DRISDOL) 1.25 MG (50000 UNIT) CAPS capsule Take 50,000 Units by mouth once a week.   "

## 2024-09-21 NOTE — Patient Instructions (Signed)

## 2024-10-16 ENCOUNTER — Ambulatory Visit: Payer: Self-pay | Admitting: Cardiology

## 2025-09-21 ENCOUNTER — Inpatient Hospital Stay: Admitting: Oncology
